# Patient Record
Sex: Female | Born: 1950
Health system: Southern US, Community
[De-identification: ages and names within clinical notes are randomized; demographics above are authoritative.]

## PROBLEM LIST (undated history)

## (undated) DIAGNOSIS — F32A Depression, unspecified: Secondary | ICD-10-CM

## (undated) DIAGNOSIS — E559 Vitamin D deficiency, unspecified: Secondary | ICD-10-CM

## (undated) DIAGNOSIS — J45909 Unspecified asthma, uncomplicated: Secondary | ICD-10-CM

## (undated) DIAGNOSIS — F419 Anxiety disorder, unspecified: Secondary | ICD-10-CM

## (undated) DIAGNOSIS — E7800 Pure hypercholesterolemia, unspecified: Secondary | ICD-10-CM

## (undated) DIAGNOSIS — M549 Dorsalgia, unspecified: Secondary | ICD-10-CM

## (undated) DIAGNOSIS — E119 Type 2 diabetes mellitus without complications: Secondary | ICD-10-CM

## (undated) DIAGNOSIS — H9190 Unspecified hearing loss, unspecified ear: Secondary | ICD-10-CM

## (undated) DIAGNOSIS — Z91018 Allergy to other foods: Secondary | ICD-10-CM

## (undated) DIAGNOSIS — J45998 Other asthma: Secondary | ICD-10-CM

## (undated) DIAGNOSIS — K219 Gastro-esophageal reflux disease without esophagitis: Secondary | ICD-10-CM

## (undated) DIAGNOSIS — R0602 Shortness of breath: Secondary | ICD-10-CM

## (undated) DIAGNOSIS — I82409 Acute embolism and thrombosis of unspecified deep veins of unspecified lower extremity: Secondary | ICD-10-CM

## (undated) DIAGNOSIS — Z8489 Family history of other specified conditions: Secondary | ICD-10-CM

## (undated) DIAGNOSIS — F329 Major depressive disorder, single episode, unspecified: Secondary | ICD-10-CM

## (undated) DIAGNOSIS — I10 Essential (primary) hypertension: Secondary | ICD-10-CM

## (undated) DIAGNOSIS — M2550 Pain in unspecified joint: Secondary | ICD-10-CM

## (undated) HISTORY — DX: Pure hypercholesterolemia, unspecified: E78.00

## (undated) HISTORY — PX: TUBAL LIGATION: SHX77

## (undated) HISTORY — DX: Vitamin D deficiency, unspecified: E55.9

## (undated) HISTORY — PX: ABDOMINAL HYSTERECTOMY: SHX81

## (undated) HISTORY — PX: COLONOSCOPY W/ BIOPSIES AND POLYPECTOMY: SHX1376

## (undated) HISTORY — PX: LAPAROSCOPIC CHOLECYSTECTOMY: SUR755

## (undated) HISTORY — DX: Allergy to other foods: Z91.018

## (undated) HISTORY — DX: Shortness of breath: R06.02

## (undated) HISTORY — PX: CARPAL TUNNEL RELEASE: SHX101

## (undated) HISTORY — DX: Pain in unspecified joint: M25.50

## (undated) HISTORY — DX: Dorsalgia, unspecified: M54.9

---

## 1999-05-14 ENCOUNTER — Other Ambulatory Visit: Admission: RE | Admit: 1999-05-14 | Discharge: 1999-05-14 | Payer: Self-pay | Admitting: Obstetrics and Gynecology

## 1999-05-14 ENCOUNTER — Encounter (INDEPENDENT_AMBULATORY_CARE_PROVIDER_SITE_OTHER): Payer: Self-pay | Admitting: Specialist

## 1999-06-28 ENCOUNTER — Inpatient Hospital Stay (HOSPITAL_COMMUNITY): Admission: RE | Admit: 1999-06-28 | Discharge: 1999-07-01 | Payer: Self-pay | Admitting: Obstetrics and Gynecology

## 1999-06-28 ENCOUNTER — Encounter (INDEPENDENT_AMBULATORY_CARE_PROVIDER_SITE_OTHER): Payer: Self-pay | Admitting: Specialist

## 1999-07-09 ENCOUNTER — Encounter: Payer: Self-pay | Admitting: Vascular Surgery

## 1999-07-09 ENCOUNTER — Inpatient Hospital Stay (HOSPITAL_COMMUNITY): Admission: EM | Admit: 1999-07-09 | Discharge: 1999-07-14 | Payer: Self-pay | Admitting: Obstetrics and Gynecology

## 1999-07-30 ENCOUNTER — Inpatient Hospital Stay (HOSPITAL_COMMUNITY): Admission: AD | Admit: 1999-07-30 | Discharge: 1999-07-30 | Payer: Self-pay | Admitting: Obstetrics and Gynecology

## 2001-04-04 ENCOUNTER — Emergency Department (HOSPITAL_COMMUNITY): Admission: EM | Admit: 2001-04-04 | Discharge: 2001-04-04 | Payer: Self-pay | Admitting: Emergency Medicine

## 2001-10-04 ENCOUNTER — Emergency Department (HOSPITAL_COMMUNITY): Admission: EM | Admit: 2001-10-04 | Discharge: 2001-10-04 | Payer: Self-pay | Admitting: Emergency Medicine

## 2001-10-05 ENCOUNTER — Encounter: Payer: Self-pay | Admitting: Emergency Medicine

## 2001-10-05 ENCOUNTER — Ambulatory Visit (HOSPITAL_COMMUNITY): Admission: RE | Admit: 2001-10-05 | Discharge: 2001-10-05 | Payer: Self-pay | Admitting: Emergency Medicine

## 2001-11-18 ENCOUNTER — Encounter: Payer: Self-pay | Admitting: Surgery

## 2001-11-22 ENCOUNTER — Observation Stay (HOSPITAL_COMMUNITY): Admission: RE | Admit: 2001-11-22 | Discharge: 2001-11-23 | Payer: Self-pay | Admitting: Surgery

## 2001-11-22 ENCOUNTER — Encounter (INDEPENDENT_AMBULATORY_CARE_PROVIDER_SITE_OTHER): Payer: Self-pay | Admitting: Specialist

## 2001-11-22 ENCOUNTER — Encounter: Payer: Self-pay | Admitting: Surgery

## 2002-06-16 ENCOUNTER — Other Ambulatory Visit: Admission: RE | Admit: 2002-06-16 | Discharge: 2002-06-16 | Payer: Self-pay | Admitting: Obstetrics and Gynecology

## 2002-07-06 ENCOUNTER — Encounter: Payer: Self-pay | Admitting: Emergency Medicine

## 2002-07-06 ENCOUNTER — Emergency Department (HOSPITAL_COMMUNITY): Admission: EM | Admit: 2002-07-06 | Discharge: 2002-07-06 | Payer: Self-pay | Admitting: Emergency Medicine

## 2003-02-14 ENCOUNTER — Ambulatory Visit (HOSPITAL_COMMUNITY): Admission: RE | Admit: 2003-02-14 | Discharge: 2003-02-14 | Payer: Self-pay | Admitting: Orthopedic Surgery

## 2003-12-22 ENCOUNTER — Encounter: Admission: RE | Admit: 2003-12-22 | Discharge: 2003-12-22 | Payer: Self-pay | Admitting: Orthopedic Surgery

## 2003-12-26 ENCOUNTER — Ambulatory Visit (HOSPITAL_BASED_OUTPATIENT_CLINIC_OR_DEPARTMENT_OTHER): Admission: RE | Admit: 2003-12-26 | Discharge: 2003-12-26 | Payer: Self-pay | Admitting: Orthopedic Surgery

## 2003-12-26 ENCOUNTER — Ambulatory Visit (HOSPITAL_COMMUNITY): Admission: RE | Admit: 2003-12-26 | Discharge: 2003-12-26 | Payer: Self-pay | Admitting: Orthopedic Surgery

## 2004-08-18 HISTORY — PX: REFRACTIVE SURGERY: SHX103

## 2004-10-08 ENCOUNTER — Emergency Department (HOSPITAL_COMMUNITY): Admission: EM | Admit: 2004-10-08 | Discharge: 2004-10-08 | Payer: Self-pay | Admitting: Emergency Medicine

## 2005-06-26 ENCOUNTER — Ambulatory Visit: Payer: Self-pay | Admitting: Cardiology

## 2005-11-28 ENCOUNTER — Encounter: Admission: RE | Admit: 2005-11-28 | Discharge: 2005-11-28 | Payer: Self-pay | Admitting: Orthopedic Surgery

## 2007-03-15 ENCOUNTER — Ambulatory Visit: Payer: Self-pay | Admitting: Internal Medicine

## 2007-03-15 DIAGNOSIS — R51 Headache: Secondary | ICD-10-CM | POA: Insufficient documentation

## 2007-03-15 DIAGNOSIS — R519 Headache, unspecified: Secondary | ICD-10-CM | POA: Insufficient documentation

## 2007-03-15 DIAGNOSIS — K219 Gastro-esophageal reflux disease without esophagitis: Secondary | ICD-10-CM | POA: Insufficient documentation

## 2007-03-15 DIAGNOSIS — E8881 Metabolic syndrome: Secondary | ICD-10-CM | POA: Insufficient documentation

## 2007-03-15 DIAGNOSIS — D869 Sarcoidosis, unspecified: Secondary | ICD-10-CM | POA: Insufficient documentation

## 2007-03-15 DIAGNOSIS — R631 Polydipsia: Secondary | ICD-10-CM | POA: Insufficient documentation

## 2007-07-01 ENCOUNTER — Ambulatory Visit (HOSPITAL_COMMUNITY): Admission: RE | Admit: 2007-07-01 | Discharge: 2007-07-01 | Payer: Self-pay | Admitting: Ophthalmology

## 2009-09-17 ENCOUNTER — Emergency Department (HOSPITAL_BASED_OUTPATIENT_CLINIC_OR_DEPARTMENT_OTHER): Admission: EM | Admit: 2009-09-17 | Discharge: 2009-09-17 | Payer: Self-pay | Admitting: Emergency Medicine

## 2009-09-17 ENCOUNTER — Ambulatory Visit: Payer: Self-pay | Admitting: Diagnostic Radiology

## 2010-09-08 ENCOUNTER — Encounter: Payer: Self-pay | Admitting: Orthopedic Surgery

## 2011-01-03 NOTE — Op Note (Signed)
NAME:  Stephanie [name], Stephanie [name]                           ACCOUNT NO.:  [id number]   MEDICAL RECORD NO.:  [id number]                   PATIENT TYPE:  AMB   LOCATION:  [facility]                                 FACILITY:  [facility]   PHYSICIAN:  Georges Lynch. Darrelyn Hillock, M.D.             DATE OF BIRTH:  [birth year]   DATE OF PROCEDURE:  12/26/2003  DATE OF DISCHARGE:                                 OPERATIVE REPORT   SURGEON:  Georges Lynch. Gioffre, M.D.   ASSISTANT:  None.   PREOPERATIVE DIAGNOSIS:  Severe carpal tunnel syndrome, left.   POSTOPERATIVE DIAGNOSIS:  Severe carpal tunnel syndrome, left.   OPERATION:  Left carpal tunnel release.   PROCEDURE IN DETAIL:  Under general anesthesia, a routine orthopedic prep  and drape of the left upper extremity was carried out.  A lazy-S incision  was made over the palmar aspect of the left hand, bleeders were identified  and cauterized.  The self-retaining retractor was inserted, identified the  superficial transverse carpal ligaments.  I protected the median nerve.  I  then incised the ligaments in the usual fashion and traced the nerve down  into the palm.  It was now free in both directions.  I thoroughly irrigated  out the area, closed the skin only with 3-0 and 4-0 nylon sutures.  Sterile  Neosporin bundle dressings were applied.  The patient left the operating  room in satisfactory condition.                                               Ronald A. Darrelyn Hillock, M.D.    RAG/MEDQ  D:  12/26/2003  T:  12/26/2003  Job:  [id number]

## 2011-01-03 NOTE — Op Note (Signed)
   NAME:  Stephanie [name], Stephanie [name]                           ACCOUNT NO.:  [id number]   MEDICAL RECORD NO.:  [id number]                   PATIENT TYPE:  AMB   LOCATION:  DAY                                  FACILITY:  [facility]   PHYSICIAN:  Georges Lynch. Darrelyn Hillock, M.D.             DATE OF BIRTH:  04-27-1951   DATE OF PROCEDURE:  02/14/2003  DATE OF DISCHARGE:                                 OPERATIVE REPORT   SURGEON:  Georges Lynch. Darrelyn Hillock, M.D.   ASSISTANT:  Nurse.   PREOPERATIVE DIAGNOSIS:  Severe carpal tunnel syndrome on the right.   POSTOPERATIVE DIAGNOSIS:  Severe carpal tunnel syndrome on the right.   OPERATION:  Right carpal tunnel release.   DESCRIPTION OF PROCEDURE:  Under IV regional anesthesia, routine orthopedic  prep and draping of the right hand was carried out. A curvilinear incision  was made over the palmar aspect of the right hand, bleeders identified and  cauterized with the bipolar. Great care was taken not to injure the  underlying neurological structures. At this time I identified the distal  transverse carpal ligaments. I protected the median nerve with a freer and  then incised the ligaments, totally freed up the nerve, her canal was  extremely tight. Following this, I thoroughly irrigated out the area, the  nerve now was freed, there was no other abnormality. I did trace the nerve  down into the palm and made sure there were no constrictions down distally.  The wound then was closed with 3-0 nylon sutures, sterile Neosporin bundle  dressing was applied. The patient left the operating room in satisfactory  condition.                                               Ronald A. Darrelyn Hillock, M.D.    RAG/MEDQ  D:  02/14/2003  T:  02/14/2003  Job:  [id number]

## 2011-01-03 NOTE — Op Note (Signed)
[facility]  Patient:    Stephanie [name], Stephanie [name] Visit Number: [id number] MRN: [id number]          Service Type: DSU Location: DAY Attending Physician:  Katha Cabal Dictated by:   Thornton Park Daphine Deutscher, M.D. Proc. Date: 11/22/01 Admit Date:  11/22/2001   CC:         Colon Branch, M.D.  [address] [place], [place] [address]   Operative Report  PREOPERATIVE DIAGNOSES: 1. Cholelithiasis. 2. Chronic cholecystitis.  POSTOPERATIVE DIAGNOSES: 1. Cholelithiasis. 2. Cholecystitis.  SURGEON:  Thornton Park. Daphine Deutscher, M.D.  ASSISTANT:  Currie Paris, M.D.  PROCEDURE:  Laparoscopic cholecystectomy and intraoperative cholangiogram.  FINDINGS:  The patient has evidence of chronic cholecystitis with adhesions to the fundus and infundibulum of the gallbladder, a normal intraoperative cholangiogram with nice tapering of the distal common bile duct and free flow into the duodenum; about a 1.5-cm yellow cholesterol stone. Small bowel adhesions noted to the lower midline incision from prior surgery at the time we were completing this operation.  DESCRIPTION OF PROCEDURE:  Stephanie [name] is a 60 year old lady who was brought to OR #1. Preoperatively, she received 500 units of subcutaneous heparin two hours preoperatively and had PAS hose in place. The abdomen was then prepped with Betadine, draped sterilely. A longitudinal incision was made into her umbilicus and through a slightly oblique type incision. I dissected down and entered the abdomen. Pursestring suture had been applied. The abdomen was insufflated. Three trocars were placed in the upper abdomen. The gallbladder was grasped and elevated. She had numerous filmy adhesions to the fundus and the infundibulum and these were stripped away. The infundibulum was retracted and dissected free of the cystic duct and put a clip up on the gallbladder and then incised the cystic duct and inserted the Reddick catheter and  took a dynamic cholangiogram which showed that this seemed to traverse to a fairly high take off of the cystic duct with good intrahepatic filling, and a slightly dilated common bile duct with free flow into the duodenum and nice tapering of the distal common bile duct. No filling defects were noted. The cystic duct was triple clipped, divided; the cystic artery was double clipped, divided, and then the gallbladder was removed with the hook. I did enter it about three-quarters of the way up the gallbladder and I decompressed it although there was no stone spillage. Hemostasis was achieved as we worked our way up the gallbladder bed and no bleeding or bile leaks were noted. Once detached, the gallbladder was placed in the bag and brought up through the umbilicus although I did have to deliver the stone with ring forceps and this enabled me to bring the gallbladder out with the bag. The umbilical defect, being somewhat oblique, I felt the best closure would be with the Endoclose device and I used that with a 0 Vicryl and inserted that and placed it across the wound and then tied that down. This closed the umbilical defect.  Also as indicated above, the patient did have some bowel loops fairly densely adherent to the lower midline which were not accessible from my upper four ports but are noted for the record. These did not appear to be obstructing or putting in her any immediate threat.  The wounds were injected with 0.5% Marcaine. The skin was closed with 4-0 Vicryl with benzoin and Steri-Strips on the skin. The patient tolerated the procedure well and was taken to the recovery room in satisfactory condition.  She will be discharged with Maxidone to take for pain and will be followed up in the office in two weeks. She will plan to stay for overnight observation. Dictated by:   Thornton Park Daphine Deutscher, M.D. Attending Physician:  Katha Cabal DD:  11/22/01 TD:  11/22/01 Job:  [id number]

## 2011-05-27 LAB — BASIC METABOLIC PANEL
Chloride: 105
Creatinine, Ser: 0.84
GFR calc non Af Amer: >60
Glucose, Bld: 122 — ABNORMAL HIGH
Potassium: 3.9
Sodium: 138

## 2012-02-27 ENCOUNTER — Other Ambulatory Visit (HOSPITAL_BASED_OUTPATIENT_CLINIC_OR_DEPARTMENT_OTHER): Payer: Self-pay | Admitting: Family Medicine

## 2012-02-27 DIAGNOSIS — M25569 Pain in unspecified knee: Secondary | ICD-10-CM

## 2012-02-28 ENCOUNTER — Other Ambulatory Visit (HOSPITAL_BASED_OUTPATIENT_CLINIC_OR_DEPARTMENT_OTHER): Payer: Self-pay

## 2012-03-02 ENCOUNTER — Ambulatory Visit (HOSPITAL_BASED_OUTPATIENT_CLINIC_OR_DEPARTMENT_OTHER)
Admission: RE | Admit: 2012-03-02 | Discharge: 2012-03-02 | Disposition: A | Discharge status: Home / Self Care | Payer: BC Managed Care – PPO | Source: Ambulatory Visit | Attending: Family Medicine | Admitting: Family Medicine

## 2012-03-02 DIAGNOSIS — M1710 Unilateral primary osteoarthritis, unspecified knee: Secondary | ICD-10-CM | POA: Insufficient documentation

## 2012-03-02 DIAGNOSIS — M7120 Synovial cyst of popliteal space [Baker], unspecified knee: Secondary | ICD-10-CM | POA: Insufficient documentation

## 2012-03-02 DIAGNOSIS — M25569 Pain in unspecified knee: Secondary | ICD-10-CM

## 2012-09-23 ENCOUNTER — Emergency Department (HOSPITAL_BASED_OUTPATIENT_CLINIC_OR_DEPARTMENT_OTHER)
Admission: EM | Admit: 2012-09-23 | Discharge: 2012-09-23 | Disposition: A | Discharge status: Home / Self Care | Payer: BC Managed Care – PPO | Attending: Emergency Medicine | Admitting: Emergency Medicine

## 2012-09-23 ENCOUNTER — Encounter (HOSPITAL_BASED_OUTPATIENT_CLINIC_OR_DEPARTMENT_OTHER): Payer: Self-pay | Admitting: Emergency Medicine

## 2012-09-23 DIAGNOSIS — I10 Essential (primary) hypertension: Secondary | ICD-10-CM | POA: Insufficient documentation

## 2012-09-23 DIAGNOSIS — Z79899 Other long term (current) drug therapy: Secondary | ICD-10-CM | POA: Insufficient documentation

## 2012-09-23 DIAGNOSIS — J45909 Unspecified asthma, uncomplicated: Secondary | ICD-10-CM | POA: Insufficient documentation

## 2012-09-23 DIAGNOSIS — K5289 Other specified noninfective gastroenteritis and colitis: Secondary | ICD-10-CM | POA: Insufficient documentation

## 2012-09-23 DIAGNOSIS — K529 Noninfective gastroenteritis and colitis, unspecified: Secondary | ICD-10-CM

## 2012-09-23 DIAGNOSIS — R6883 Chills (without fever): Secondary | ICD-10-CM | POA: Insufficient documentation

## 2012-09-23 DIAGNOSIS — R197 Diarrhea, unspecified: Secondary | ICD-10-CM | POA: Insufficient documentation

## 2012-09-23 DIAGNOSIS — R5381 Other malaise: Secondary | ICD-10-CM | POA: Insufficient documentation

## 2012-09-23 HISTORY — DX: Unspecified asthma, uncomplicated: J45.909

## 2012-09-23 HISTORY — DX: Essential (primary) hypertension: I10

## 2012-09-23 LAB — COMPREHENSIVE METABOLIC PANEL
AST: 55 U/L — ABNORMAL HIGH (ref 0–37)
Albumin: 3.9 g/dL (ref 3.5–5.2)
Alkaline Phosphatase: 74 U/L (ref 39–117)
CO2: 21 mEq/L (ref 19–32)
Glucose, Bld: 214 mg/dL — ABNORMAL HIGH (ref 70–99)
Potassium: 3.5 mEq/L (ref 3.5–5.1)
Sodium: 139 mEq/L (ref 135–145)
Total Bilirubin: 0.6 mg/dL (ref 0.3–1.2)

## 2012-09-23 LAB — CBC WITH DIFFERENTIAL/PLATELET
Basophils Absolute: 0 10*3/uL (ref 0.0–0.1)
HCT: 38.6 % (ref 36.0–46.0)
Lymphocytes Relative: 7 % — ABNORMAL LOW (ref 12–46)
MCH: 30.6 pg (ref 26.0–34.0)
MCHC: 35.2 g/dL (ref 30.0–36.0)
Neutro Abs: 8.6 10*3/uL — ABNORMAL HIGH (ref 1.7–7.7)
Neutrophils Relative %: 87 % — ABNORMAL HIGH (ref 43–77)
RDW: 14 % (ref 11.5–15.5)

## 2012-09-23 MED ORDER — PROMETHAZINE HCL 25 MG/ML IJ SOLN
12.5000 mg | Freq: Once | INTRAMUSCULAR | Status: AC
  Administered 2012-09-23: 12.5 mg via INTRAVENOUS
  Filled 2012-09-23: qty 1

## 2012-09-23 MED ORDER — SODIUM CHLORIDE 0.9 % IV BOLUS (SEPSIS)
1000.0000 mL | Freq: Once | INTRAVENOUS | Status: AC
  Administered 2012-09-23: 1000 mL via INTRAVENOUS

## 2012-09-23 MED ORDER — ONDANSETRON HCL 8 MG PO TABS: 8.0000 mg | ORAL_TABLET | ORAL | Status: DC | PRN

## 2012-09-23 MED ORDER — KETOROLAC TROMETHAMINE 30 MG/ML IJ SOLN
INTRAMUSCULAR | Status: AC
  Administered 2012-09-23: 30 mg via INTRAVENOUS
  Filled 2012-09-23: qty 1

## 2012-09-23 MED ORDER — ONDANSETRON HCL 4 MG/2ML IJ SOLN
4.0000 mg | Freq: Once | INTRAMUSCULAR | Status: AC
  Administered 2012-09-23: 4 mg via INTRAVENOUS

## 2012-09-23 MED ORDER — SODIUM CHLORIDE 0.9 % IV BOLUS (SEPSIS): 1000.0000 mL | Freq: Once | INTRAVENOUS | Status: DC

## 2012-09-23 MED ORDER — PROMETHAZINE HCL 25 MG/ML IJ SOLN: 12.5000 mg | Freq: Once | INTRAMUSCULAR | Status: DC

## 2012-09-23 MED ORDER — KETOROLAC TROMETHAMINE 30 MG/ML IJ SOLN
30.0000 mg | Freq: Once | INTRAMUSCULAR | Status: AC
  Administered 2012-09-23: 30 mg via INTRAVENOUS

## 2012-09-23 MED ORDER — ONDANSETRON HCL 4 MG/2ML IJ SOLN
INTRAMUSCULAR | Status: AC
  Administered 2012-09-23: 4 mg via INTRAVENOUS
  Filled 2012-09-23: qty 2

## 2012-09-23 NOTE — ED Notes (Addendum)
Nausea since 4pm yesterday afternoon.  HA and bodyaches.  Ate supper at 7pm. Developed Vomiting and diarrhea at 9pm.

## 2012-09-23 NOTE — ED Provider Notes (Signed)
History     CSN: [id number]  Arrival date & time      First MD Initiated Contact with Patient 09/23/12 0138      Chief Complaint  Patient presents with  . Emesis  . Diarrhea    (Consider location/radiation/quality/duration/timing/severity/associated sxs/prior treatment) HPI Comments: Patient by EMS for eval of sudden onset of n/v/d at 4 PM yesterday.  All non-bloody.  She denies fevers or chills.  Denies sick contacts.  Felt fine prior to this afternoon.    Patient is a 62 y.o. female presenting with vomiting. The history is provided by the patient.  Emesis  This is a new problem. Episode onset: 1600 yesterday. The problem occurs continuously. The problem has been rapidly worsening. The emesis has an appearance of stomach contents. There has been no fever. Associated symptoms include chills and diarrhea. Pertinent negatives include no fever.    Past Medical History  Diagnosis Date  . Asthma     No past surgical history on file.  No family history on file.  History  Substance Use Topics  . Smoking status: Not on file  . Smokeless tobacco: Not on file  . Alcohol Use:     OB History    Grav Para Term Preterm Abortions TAB SAB Ect Mult Living                  Review of Systems  Constitutional: Positive for chills and fatigue. Negative for fever.  Gastrointestinal: Positive for nausea, vomiting and diarrhea.  All other systems reviewed and are negative.    Allergies  Review of patient's allergies indicates not on file.  Home Medications  No current outpatient prescriptions on file.  SpO2 96%  Physical Exam  Nursing note and vitals reviewed. Constitutional: She is oriented to person, place, and time. She appears well-developed and well-nourished.       Appears pale, uncomfortable.  HENT:  Head: Normocephalic and atraumatic.  Mouth/Throat: Oropharynx is clear and moist.       MM's moist.  Neck: Normal range of motion. Neck supple.  Cardiovascular: Normal  rate and regular rhythm.  Exam reveals no gallop and no friction rub.   No murmur heard. Pulmonary/Chest: Effort normal and breath sounds normal. No respiratory distress. She has no wheezes.  Abdominal: Soft. Bowel sounds are normal. She exhibits no distension. There is no tenderness.  Musculoskeletal: Normal range of motion.  Neurological: She is alert and oriented to person, place, and time.  Skin: Skin is warm and dry.    ED Course  Procedures (including critical care time)   Labs Reviewed  CBC WITH DIFFERENTIAL  COMPREHENSIVE METABOLIC PANEL   No results found.   No diagnosis found.    MDM  Presentation, exam, and labs consistent with viral gastroenteritis.  She is feeling better with fluids and meds and will be discharged to home with zofran.  Return prn.        Geoffery Lyons, MD 09/23/12 [phone]

## 2012-09-23 NOTE — ED Notes (Signed)
MD at bedside. 

## 2015-12-12 DIAGNOSIS — J45909 Unspecified asthma, uncomplicated: Secondary | ICD-10-CM | POA: Diagnosis not present

## 2015-12-12 DIAGNOSIS — I10 Essential (primary) hypertension: Secondary | ICD-10-CM | POA: Diagnosis not present

## 2015-12-12 DIAGNOSIS — K219 Gastro-esophageal reflux disease without esophagitis: Secondary | ICD-10-CM | POA: Diagnosis not present

## 2015-12-12 DIAGNOSIS — E7800 Pure hypercholesterolemia, unspecified: Secondary | ICD-10-CM | POA: Diagnosis not present

## 2015-12-12 DIAGNOSIS — E559 Vitamin D deficiency, unspecified: Secondary | ICD-10-CM | POA: Diagnosis not present

## 2015-12-12 DIAGNOSIS — Z1329 Encounter for screening for other suspected endocrine disorder: Secondary | ICD-10-CM | POA: Diagnosis not present

## 2015-12-18 DIAGNOSIS — R7301 Impaired fasting glucose: Secondary | ICD-10-CM | POA: Diagnosis not present

## 2016-01-02 DIAGNOSIS — E119 Type 2 diabetes mellitus without complications: Secondary | ICD-10-CM | POA: Diagnosis not present

## 2016-01-02 DIAGNOSIS — E7800 Pure hypercholesterolemia, unspecified: Secondary | ICD-10-CM | POA: Diagnosis not present

## 2016-02-11 ENCOUNTER — Ambulatory Visit: Payer: BLUE CROSS/BLUE SHIELD | Admitting: Nutrition

## 2016-02-21 ENCOUNTER — Other Ambulatory Visit: Payer: Self-pay | Admitting: Family Medicine

## 2016-02-21 DIAGNOSIS — D120 Benign neoplasm of cecum: Secondary | ICD-10-CM | POA: Diagnosis not present

## 2016-02-21 DIAGNOSIS — K6389 Other specified diseases of intestine: Secondary | ICD-10-CM | POA: Diagnosis not present

## 2016-02-21 DIAGNOSIS — D126 Benign neoplasm of colon, unspecified: Secondary | ICD-10-CM | POA: Diagnosis not present

## 2016-02-21 DIAGNOSIS — K5730 Diverticulosis of large intestine without perforation or abscess without bleeding: Secondary | ICD-10-CM | POA: Diagnosis not present

## 2016-02-21 DIAGNOSIS — Z1211 Encounter for screening for malignant neoplasm of colon: Secondary | ICD-10-CM | POA: Diagnosis not present

## 2016-02-21 DIAGNOSIS — K648 Other hemorrhoids: Secondary | ICD-10-CM | POA: Diagnosis not present

## 2016-02-21 DIAGNOSIS — K635 Polyp of colon: Secondary | ICD-10-CM | POA: Diagnosis not present

## 2016-02-21 DIAGNOSIS — D124 Benign neoplasm of descending colon: Secondary | ICD-10-CM | POA: Diagnosis not present

## 2016-02-25 DIAGNOSIS — Z1231 Encounter for screening mammogram for malignant neoplasm of breast: Secondary | ICD-10-CM | POA: Diagnosis not present

## 2016-04-22 DIAGNOSIS — I10 Essential (primary) hypertension: Secondary | ICD-10-CM | POA: Diagnosis not present

## 2016-04-22 DIAGNOSIS — E7800 Pure hypercholesterolemia, unspecified: Secondary | ICD-10-CM | POA: Diagnosis not present

## 2016-04-22 DIAGNOSIS — J45909 Unspecified asthma, uncomplicated: Secondary | ICD-10-CM | POA: Diagnosis not present

## 2016-04-22 DIAGNOSIS — E119 Type 2 diabetes mellitus without complications: Secondary | ICD-10-CM | POA: Diagnosis not present

## 2016-04-22 DIAGNOSIS — E559 Vitamin D deficiency, unspecified: Secondary | ICD-10-CM | POA: Diagnosis not present

## 2016-04-22 DIAGNOSIS — K219 Gastro-esophageal reflux disease without esophagitis: Secondary | ICD-10-CM | POA: Diagnosis not present

## 2016-05-12 DIAGNOSIS — Z23 Encounter for immunization: Secondary | ICD-10-CM | POA: Diagnosis not present

## 2016-08-06 DIAGNOSIS — J45901 Unspecified asthma with (acute) exacerbation: Secondary | ICD-10-CM | POA: Diagnosis not present

## 2016-08-06 DIAGNOSIS — R0789 Other chest pain: Secondary | ICD-10-CM | POA: Diagnosis not present

## 2016-08-17 DIAGNOSIS — R05 Cough: Secondary | ICD-10-CM | POA: Diagnosis not present

## 2016-10-13 ENCOUNTER — Ambulatory Visit (HOSPITAL_BASED_OUTPATIENT_CLINIC_OR_DEPARTMENT_OTHER)
Admission: RE | Admit: 2016-10-13 | Discharge: 2016-10-13 | Disposition: A | Discharge status: Home / Self Care | Payer: BLUE CROSS/BLUE SHIELD | Source: Ambulatory Visit | Attending: Family Medicine | Admitting: Family Medicine

## 2016-10-13 ENCOUNTER — Other Ambulatory Visit (HOSPITAL_BASED_OUTPATIENT_CLINIC_OR_DEPARTMENT_OTHER): Payer: Self-pay | Admitting: Family Medicine

## 2016-10-13 ENCOUNTER — Encounter (HOSPITAL_BASED_OUTPATIENT_CLINIC_OR_DEPARTMENT_OTHER): Payer: Self-pay

## 2016-10-13 DIAGNOSIS — R1032 Left lower quadrant pain: Secondary | ICD-10-CM | POA: Diagnosis not present

## 2016-10-13 DIAGNOSIS — K5732 Diverticulitis of large intestine without perforation or abscess without bleeding: Secondary | ICD-10-CM | POA: Diagnosis not present

## 2016-10-13 DIAGNOSIS — R8299 Other abnormal findings in urine: Secondary | ICD-10-CM | POA: Diagnosis not present

## 2016-10-13 MED ORDER — IOPAMIDOL (ISOVUE-300) INJECTION 61%
100.0000 mL | Freq: Once | INTRAVENOUS | Status: AC | PRN
  Administered 2016-10-13: 100 mL via INTRAVENOUS

## 2016-11-10 DIAGNOSIS — E119 Type 2 diabetes mellitus without complications: Secondary | ICD-10-CM | POA: Diagnosis not present

## 2016-11-10 DIAGNOSIS — E7800 Pure hypercholesterolemia, unspecified: Secondary | ICD-10-CM | POA: Diagnosis not present

## 2016-11-10 DIAGNOSIS — I10 Essential (primary) hypertension: Secondary | ICD-10-CM | POA: Diagnosis not present

## 2016-12-31 DIAGNOSIS — M25511 Pain in right shoulder: Secondary | ICD-10-CM | POA: Diagnosis not present

## 2016-12-31 DIAGNOSIS — M542 Cervicalgia: Secondary | ICD-10-CM | POA: Diagnosis not present

## 2017-01-14 DIAGNOSIS — Z79899 Other long term (current) drug therapy: Secondary | ICD-10-CM | POA: Diagnosis not present

## 2017-01-14 DIAGNOSIS — E7800 Pure hypercholesterolemia, unspecified: Secondary | ICD-10-CM | POA: Diagnosis not present

## 2017-04-21 DIAGNOSIS — R1012 Left upper quadrant pain: Secondary | ICD-10-CM | POA: Diagnosis not present

## 2017-04-30 DIAGNOSIS — J45909 Unspecified asthma, uncomplicated: Secondary | ICD-10-CM | POA: Diagnosis not present

## 2017-04-30 DIAGNOSIS — K5792 Diverticulitis of intestine, part unspecified, without perforation or abscess without bleeding: Secondary | ICD-10-CM | POA: Diagnosis not present

## 2017-04-30 DIAGNOSIS — I10 Essential (primary) hypertension: Secondary | ICD-10-CM | POA: Diagnosis not present

## 2017-04-30 DIAGNOSIS — E119 Type 2 diabetes mellitus without complications: Secondary | ICD-10-CM | POA: Diagnosis not present

## 2017-04-30 DIAGNOSIS — Z23 Encounter for immunization: Secondary | ICD-10-CM | POA: Diagnosis not present

## 2017-05-27 DIAGNOSIS — Z23 Encounter for immunization: Secondary | ICD-10-CM | POA: Diagnosis not present

## 2017-06-02 DIAGNOSIS — Z1231 Encounter for screening mammogram for malignant neoplasm of breast: Secondary | ICD-10-CM | POA: Diagnosis not present

## 2017-06-24 DIAGNOSIS — L02416 Cutaneous abscess of left lower limb: Secondary | ICD-10-CM | POA: Diagnosis not present

## 2017-09-29 ENCOUNTER — Other Ambulatory Visit: Payer: Self-pay

## 2017-09-29 ENCOUNTER — Emergency Department (HOSPITAL_BASED_OUTPATIENT_CLINIC_OR_DEPARTMENT_OTHER): Payer: BLUE CROSS/BLUE SHIELD

## 2017-09-29 ENCOUNTER — Emergency Department (HOSPITAL_BASED_OUTPATIENT_CLINIC_OR_DEPARTMENT_OTHER)
Admission: EM | Admit: 2017-09-29 | Discharge: 2017-09-29 | Disposition: A | Discharge status: Home / Self Care | Payer: BLUE CROSS/BLUE SHIELD | Attending: Emergency Medicine | Admitting: Emergency Medicine

## 2017-09-29 ENCOUNTER — Encounter (HOSPITAL_BASED_OUTPATIENT_CLINIC_OR_DEPARTMENT_OTHER): Payer: Self-pay | Admitting: Emergency Medicine

## 2017-09-29 DIAGNOSIS — Z79899 Other long term (current) drug therapy: Secondary | ICD-10-CM | POA: Insufficient documentation

## 2017-09-29 DIAGNOSIS — R1032 Left lower quadrant pain: Secondary | ICD-10-CM | POA: Diagnosis not present

## 2017-09-29 DIAGNOSIS — K5792 Diverticulitis of intestine, part unspecified, without perforation or abscess without bleeding: Secondary | ICD-10-CM | POA: Insufficient documentation

## 2017-09-29 DIAGNOSIS — Z7982 Long term (current) use of aspirin: Secondary | ICD-10-CM | POA: Insufficient documentation

## 2017-09-29 DIAGNOSIS — R110 Nausea: Secondary | ICD-10-CM | POA: Diagnosis not present

## 2017-09-29 DIAGNOSIS — I10 Essential (primary) hypertension: Secondary | ICD-10-CM | POA: Insufficient documentation

## 2017-09-29 DIAGNOSIS — J45909 Unspecified asthma, uncomplicated: Secondary | ICD-10-CM | POA: Diagnosis not present

## 2017-09-29 LAB — CBC
HCT: 38.2 % (ref 36.0–46.0)
HEMOGLOBIN: 12.6 g/dL (ref 12.0–15.0)
MCH: 29.3 pg (ref 26.0–34.0)
MCHC: 33 g/dL (ref 30.0–36.0)
MCV: 88.8 fL (ref 78.0–100.0)
PLATELETS: 307 10*3/uL (ref 150–400)
RBC: 4.3 MIL/uL (ref 3.87–5.11)
RDW: 14.8 % (ref 11.5–15.5)
WBC: 10.5 10*3/uL (ref 4.0–10.5)

## 2017-09-29 LAB — COMPREHENSIVE METABOLIC PANEL
ALBUMIN: 3.9 g/dL (ref 3.5–5.0)
ALK PHOS: 125 U/L (ref 38–126)
ALT: 25 U/L (ref 14–54)
ANION GAP: 12 (ref 5–15)
AST: 29 U/L (ref 15–41)
BILIRUBIN TOTAL: 0.5 mg/dL (ref 0.3–1.2)
BUN: 13 mg/dL (ref 6–20)
CALCIUM: 9.6 mg/dL (ref 8.9–10.3)
CO2: 25 mmol/L (ref 22–32)
CREATININE: 0.61 mg/dL (ref 0.44–1.00)
Chloride: 102 mmol/L (ref 101–111)
GFR calc Af Amer: >60 mL/min (ref >60)
GFR calc non Af Amer: >60 mL/min (ref >60)
GLUCOSE: 83 mg/dL (ref 65–99)
Potassium: 3.5 mmol/L (ref 3.5–5.1)
Sodium: 139 mmol/L (ref 135–145)
Total Protein: 8.2 g/dL — ABNORMAL HIGH (ref 6.5–8.1)

## 2017-09-29 LAB — URINALYSIS, ROUTINE W REFLEX MICROSCOPIC
Glucose, UA: NEGATIVE mg/dL
KETONES UR: NEGATIVE mg/dL
LEUKOCYTES UA: NEGATIVE
Nitrite: NEGATIVE
PROTEIN: NEGATIVE mg/dL
Specific Gravity, Urine: 1.025 (ref 1.005–1.030)
pH: 6 (ref 5.0–8.0)

## 2017-09-29 LAB — URINALYSIS, MICROSCOPIC (REFLEX)

## 2017-09-29 LAB — LIPASE, BLOOD: Lipase: 28 U/L (ref 11–51)

## 2017-09-29 MED ORDER — ONDANSETRON 4 MG PO TBDP: 4.0000 mg | ORAL_TABLET | Freq: Three times a day (TID) | ORAL | 0 refills | Status: DC | PRN

## 2017-09-29 MED ORDER — SODIUM CHLORIDE 0.9 % IV BOLUS (SEPSIS)
1000.0000 mL | Freq: Once | INTRAVENOUS | Status: AC
  Administered 2017-09-29: 1000 mL via INTRAVENOUS

## 2017-09-29 MED ORDER — AMOXICILLIN-POT CLAVULANATE 875-125 MG PO TABS: 1.0000 | ORAL_TABLET | Freq: Two times a day (BID) | ORAL | 0 refills | Status: DC

## 2017-09-29 MED ORDER — IOPAMIDOL (ISOVUE-300) INJECTION 61%
100.0000 mL | Freq: Once | INTRAVENOUS | Status: AC | PRN
  Administered 2017-09-29: 100 mL via INTRAVENOUS

## 2017-09-29 MED ORDER — MORPHINE SULFATE (PF) 4 MG/ML IV SOLN
4.0000 mg | Freq: Once | INTRAVENOUS | Status: AC
  Administered 2017-09-29: 4 mg via INTRAVENOUS
  Filled 2017-09-29: qty 1

## 2017-09-29 MED ORDER — ONDANSETRON HCL 4 MG/2ML IJ SOLN
4.0000 mg | Freq: Once | INTRAMUSCULAR | Status: AC
  Administered 2017-09-29: 4 mg via INTRAVENOUS
  Filled 2017-09-29: qty 2

## 2017-09-29 NOTE — ED Notes (Signed)
Urine sample and culture obtained and sent to lab.  

## 2017-09-29 NOTE — ED Provider Notes (Signed)
[facility] EMERGENCY DEPARTMENT Provider Note   CSN: [id number] Arrival date & time: 09/29/17  1111     History   Chief Complaint Chief Complaint  Patient presents with  . Abdominal Pain    HPI Stephanie [name] is a 67 y.o. female with history of GERD, cholecystectomy, hysterectomy, bilateral oophorectomy, diverticulitis here for evaluation of constant, stabbing, left lower quadrant abdominal pain since Friday, 5 days associated with mild intermittent nausea. She called her primary care doctor and he prescribed her amoxicillin as he suspected it may be diverticulitis again. No fevers, chills, chest pain, shortness of breath, vomiting, dysuria, hematuria, urinary frequency, abnormal vaginal discharge or bleeding, diarrhea, melena or hematochezia. States this is the third time she's had pain in her left lower quadrant and every time it feels the same, she is suspecting this is diverticulitis again. HPI  Past Medical History:  Diagnosis Date  . Asthma   . Hypertension     Patient Active Problem List   Diagnosis Date Noted  . SARCOIDOSIS 03/15/2007  . METABOLIC SYNDROME X 03/15/2007  . G E R D 03/15/2007  . SYMPTOM, POLYDIPSIA 03/15/2007  . SYMPTOM, HEADACHE 03/15/2007    Past Surgical History:  Procedure Laterality Date  . ABDOMINAL HYSTERECTOMY    . CARPAL TUNNEL RELEASE    . CHOLECYSTECTOMY      OB History    No data available       Home Medications    Prior to Admission medications   Medication Sig Start Date End Date Taking? Authorizing Provider  aspirin EC 81 MG tablet Take 81 mg by mouth daily.   Yes [provider]  RABEprazole (ACIPHEX) 20 MG tablet Take 20 mg by mouth daily.   Yes [provider]  albuterol (PROVENTIL HFA;VENTOLIN HFA) 108 (90 BASE) MCG/ACT inhaler Inhale 2 puffs into the lungs every 6 (six) hours as needed.    [provider]  amoxicillin-clavulanate (AUGMENTIN) 875-125 MG tablet Take 1 tablet by mouth  every 12 (twelve) hours. 09/29/17   Liberty HandyGibbons, Claudia J, PA-C  atenolol (TENORMIN) 100 MG tablet Take 100 mg by mouth daily.    [provider]  ondansetron (ZOFRAN ODT) 4 MG disintegrating tablet Take 1 tablet (4 mg total) by mouth every 8 (eight) hours as needed for nausea or vomiting. 09/29/17   Liberty HandyGibbons, Claudia J, PA-C  ondansetron (ZOFRAN) 8 MG tablet Take 1 tablet (8 mg total) by mouth every 4 (four) hours as needed for nausea. 09/23/12   Geoffery Lyonselo, Douglas, MD  pantoprazole (PROTONIX) 20 MG tablet Take 20 mg by mouth daily.    [provider]  sertraline (ZOLOFT) 100 MG tablet Take 100 mg by mouth daily.    [provider]    Family History History reviewed. No pertinent family history.  Social History Social History   Tobacco Use  . Smoking status: Former Games developermoker  . Smokeless tobacco: Never Used  Substance Use Topics  . Alcohol use: No  . Drug use: No     Allergies   Albuterol; Flagyl [metronidazole]; and Shellfish allergy   Review of Systems Review of Systems  Gastrointestinal: Positive for abdominal pain and nausea.  All other systems reviewed and are negative.    Physical Exam Updated Vital Signs BP 130/80 (BP Location: Right Arm)   Pulse 80   Temp 98.2 F (36.8 C) (Oral)   Resp 16   Ht 4' 10.75" (1.492 m)   Wt 92.5 kg (204 lb)   SpO2 99%  BMI 41.55 kg/m   Physical Exam  Constitutional: She is oriented to person, place, and time. She appears well-developed and well-nourished. No distress.  Nontoxic.  HENT:  Head: Normocephalic and atraumatic.  Nose: Nose normal.  Mouth/Throat: Oropharynx is clear and moist. No oropharyngeal exudate.  Moist mucous membranes.  Eyes: Conjunctivae and EOM are normal. Pupils are equal, round, and reactive to light.  Neck: Normal range of motion. Neck supple.  Cardiovascular: Normal rate, regular rhythm, normal heart sounds and intact distal pulses.  No murmur heard. 2+ DP and radial pulses bilaterally.  No lower extremity edema.  Pulmonary/Chest: Effort normal and breath sounds normal. She exhibits no tenderness.  No chest tenderness.  Abdominal: Soft. Bowel sounds are normal. She exhibits no distension. There is tenderness in the left lower quadrant.  Obese abdomen. Soft. Focal tenderness to LLQ with guarding. No R/R. No suprapubic or CVAT.   Musculoskeletal: Normal range of motion. She exhibits no deformity.  Lymphadenopathy:    She has no cervical adenopathy.  Neurological: She is alert and oriented to person, place, and time. No sensory deficit.  Skin: Skin is warm and dry. Capillary refill takes less than 2 seconds.  No rash to abdomen   Psychiatric: She has a normal mood and affect. Her behavior is normal. Judgment and thought content normal.  Nursing note and vitals reviewed.    ED Treatments / Results  Labs (all labs ordered are listed, but only abnormal results are displayed) Labs Reviewed  URINALYSIS, ROUTINE W REFLEX MICROSCOPIC - Abnormal; Notable for the following components:      Result Value   Hgb urine dipstick SMALL (*)    Bilirubin Urine SMALL (*)    All other components within normal limits  URINALYSIS, MICROSCOPIC (REFLEX) - Abnormal; Notable for the following components:   Bacteria, UA RARE (*)    Squamous Epithelial / LPF 0-5 (*)    All other components within normal limits  COMPREHENSIVE METABOLIC PANEL - Abnormal; Notable for the following components:   Total Protein 8.2 (*)    All other components within normal limits  URINE CULTURE  LIPASE, BLOOD  CBC    EKG  EKG Interpretation None       Radiology Ct Abdomen Pelvis W Contrast  Result Date: 09/29/2017 CLINICAL DATA:  Left lower quadrant pain and nausea for 4 days. History of diverticulitis. EXAM: CT ABDOMEN AND PELVIS WITH CONTRAST TECHNIQUE: Multidetector CT imaging of the abdomen and pelvis was performed using the standard protocol following bolus administration of intravenous contrast.  CONTRAST:  100 cc of Isovue 300 COMPARISON:  10/13/2016 FINDINGS: Lower chest: Tiny right lower lobe pulmonary nodule can be presumed benign, given stability of nearly 1 year. Mild cardiomegaly, without pericardial effusion. Lad coronary artery atherosclerosis. Hepatobiliary: Normal liver. Cholecystectomy, without biliary ductal dilatation. Pancreas: Normal, without mass or ductal dilatation. Spleen: Normal in size, without focal abnormality. Adrenals/Urinary Tract: Normal adrenal glands. Too small to characterize upper pole right renal lesion. Normal left kidney, without hydronephrosis. Normal urinary bladder. Stomach/Bowel: Normal stomach, without wall thickening. Extensive colonic diverticulosis. Moderate wall thickening of and edema surrounding the sigmoid, consistent with diverticulitis. No free extraluminal gas or pericolonic fluid collection. Normal terminal ileum and appendix.  Normal small bowel. Vascular/Lymphatic: Aortic and branch vessel atherosclerosis. No abdominopelvic adenopathy. Upper normal sized nodes in the sigmoid mesocolon are likely reactive. Example at 7 mm on image 66/series 2. Reproductive: Hysterectomy.  No adnexal mass. Other: Trace left pelvic fluid is likely secondary to the colonic  inflammation. Mild pelvic floor laxity. Musculoskeletal: No acute osseous abnormality. IMPRESSION: 1. Moderate non complicated sigmoid diverticulitis. 2. Age advanced coronary artery atherosclerosis. Recommend assessment of coronary risk factors and consideration of medical therapy. 3.  Aortic Atherosclerosis (ICD10-I70.0). 4. Trace left pelvic fluid, likely secondary. Electronically Signed   By: Jeronimo Greaves M.D.   On: 09/29/2017 17:34    Procedures Procedures (including critical care time)  Medications Ordered in ED Medications  sodium chloride 0.9 % bolus 1,000 mL (1,000 mLs Intravenous New Bag/Given 09/29/17 1615)  ondansetron (ZOFRAN) injection 4 mg (4 mg Intravenous Given 09/29/17 1616)    morphine 4 MG/ML injection 4 mg (4 mg Intravenous Given 09/29/17 1617)  iopamidol (ISOVUE-300) 61 % injection 100 mL (100 mLs Intravenous Contrast Given 09/29/17 1703)     Initial Impression / Assessment and Plan / ED Course  I have reviewed the triage vital signs and the nursing notes.  Pertinent labs & imaging results that were available during my care of the patient were reviewed by me and considered in my medical decision making (see chart for details).  Clinical Course as of Sep 29 1746  Tue Sep 29, 2017  1738 IMPRESSION: 1. Moderate non complicated sigmoid diverticulitis. 2. Age advanced coronary artery atherosclerosis. Recommend assessment of coronary risk factors and consideration of medical therapy. 3. Aortic Atherosclerosis (ICD10-I70.0). 4. Trace left pelvic fluid, likely secondary. CT ABDOMEN PELVIS W CONTRAST [CG]    Clinical Course User Index [CG] Liberty Handy, PA-C   67 year old female with history of diverticulitis is here for her classic pain in the left lower quadrant for when she gets diverticulitis. Mild nausea. No systemic symptoms of infection. Exam is reassuring, she has focal tenderness in the left lower quadrant but no peritoneal signs. She does not look dehydrated. Afebrile. She has history of hysterectomy and bilateral oophorectomy, this is unlikely to be GU/GI in etiology.  Labwork reassuring without leukocytosis. Normal creatinine. Normal liver function tests. Urine does not look infected. CT scan shows uncomplicated diverticulitis without abscess or perforation. Pain and nausea was controlled in the emergency department. We'll attempt fluid challenge. We'll discharge with Augmentin and Zofran. She reports intolerance to Flagyl due to nausea. Recommended she call her primary care doctor in 24-48 hours for a check and in to ensure symptoms are improving. Discussed return percussion.  Final Clinical Impressions(s) / ED Diagnoses   Final diagnoses:   Diverticulitis    ED Discharge Orders        Ordered    amoxicillin-clavulanate (AUGMENTIN) 875-125 MG tablet  Every 12 hours     09/29/17 1744    ondansetron (ZOFRAN ODT) 4 MG disintegrating tablet  Every 8 hours PRN     09/29/17 1744       Liberty Handy, PA-C 09/29/17 1748    Pricilla Loveless, MD 09/30/17 [phone]

## 2017-09-29 NOTE — ED Triage Notes (Signed)
Patient states that she is having pain to her left lower stomach and states that she knows it diverticulitis. Was at the dr earlier and they sent her here for further evaluation

## 2017-09-29 NOTE — Discharge Instructions (Signed)
CT scan showed uncomplicated diverticulitis without abscess or perforation. We will treat this with Augmentin. Take Zofran for nausea. Stay well-hydrated. You may take [phone] mg of Tylenol for the pain. I recommend you call your primary care doctor in the next 24-48 hrs. to do a check-in and to ensure that your nausea, abdominal pain and symptoms are improving. Return to the emergency department for worsening abdominal pain, vomiting, fevers, chills.

## 2017-09-30 LAB — URINE CULTURE: CULTURE: NO GROWTH

## 2017-10-06 DIAGNOSIS — B349 Viral infection, unspecified: Secondary | ICD-10-CM | POA: Diagnosis not present

## 2017-10-28 DIAGNOSIS — E119 Type 2 diabetes mellitus without complications: Secondary | ICD-10-CM | POA: Diagnosis not present

## 2017-10-28 DIAGNOSIS — J45909 Unspecified asthma, uncomplicated: Secondary | ICD-10-CM | POA: Diagnosis not present

## 2017-10-28 DIAGNOSIS — I10 Essential (primary) hypertension: Secondary | ICD-10-CM | POA: Diagnosis not present

## 2017-10-28 DIAGNOSIS — K219 Gastro-esophageal reflux disease without esophagitis: Secondary | ICD-10-CM | POA: Diagnosis not present

## 2017-10-28 DIAGNOSIS — E7800 Pure hypercholesterolemia, unspecified: Secondary | ICD-10-CM | POA: Diagnosis not present

## 2017-10-28 DIAGNOSIS — E559 Vitamin D deficiency, unspecified: Secondary | ICD-10-CM | POA: Diagnosis not present

## 2017-11-03 DIAGNOSIS — L02214 Cutaneous abscess of groin: Secondary | ICD-10-CM | POA: Diagnosis not present

## 2018-01-05 DIAGNOSIS — S80861A Insect bite (nonvenomous), right lower leg, initial encounter: Secondary | ICD-10-CM | POA: Diagnosis not present

## 2018-01-05 DIAGNOSIS — L0291 Cutaneous abscess, unspecified: Secondary | ICD-10-CM | POA: Diagnosis not present

## 2018-05-03 DIAGNOSIS — I10 Essential (primary) hypertension: Secondary | ICD-10-CM | POA: Diagnosis not present

## 2018-05-03 DIAGNOSIS — E119 Type 2 diabetes mellitus without complications: Secondary | ICD-10-CM | POA: Diagnosis not present

## 2018-05-03 DIAGNOSIS — J45909 Unspecified asthma, uncomplicated: Secondary | ICD-10-CM | POA: Diagnosis not present

## 2018-05-03 DIAGNOSIS — K219 Gastro-esophageal reflux disease without esophagitis: Secondary | ICD-10-CM | POA: Diagnosis not present

## 2018-05-24 ENCOUNTER — Ambulatory Visit: Payer: Self-pay | Admitting: General Surgery

## 2018-05-24 DIAGNOSIS — K5792 Diverticulitis of intestine, part unspecified, without perforation or abscess without bleeding: Secondary | ICD-10-CM | POA: Diagnosis not present

## 2018-05-24 NOTE — H&P (Signed)
History of Present Illness Axel Filler MD; 05/24/2018 11:34 AM) The patient is a 67 year old female who presents with diverticulitis. Patient is a 67 year old female with a history of hypertension, diabetes, hypercholesterolemia, remote history of DVT, who comes in with recurrent bouts of diverticulitis. Patient states over the last year she's had 4 bouts of diverticulitis or treated as an outpatient. Patient states that the pain usually was a left lower quadrant. She states this was associated with constipation. Patient has had multiple CT scans reveal lower sigmoid diverticulitis. Most recent CT scan was in February 2019. I did review this personally.  Patient did have a recent colonoscopy 2 years ago by Dr. Bosie Clos and had 2 polyps that were benign.  Patient's had a previous laparoscopic cholecystectomy, open hysterectomy via Pfannenstiel incision   Past Surgical History (Hadelyn Massenburg, LPN; 16/08/958 45:40 AM) Colon Polyp Removal - Colonoscopy  Gallbladder Surgery - Laparoscopic  Hysterectomy (not due to cancer) - Complete   Diagnostic Studies History (Hadelyn Massenburg, LPN; 98/08/1912 78:29 AM) Colonoscopy  1-5 years ago Mammogram  within last year  Allergies [facility], LPN; 56/09/1306 65:78 AM) No Known Drug Allergies [05/24/2018]: Allergies Reconciled   Medication History (Hadelyn Massenburg, LPN; 46/04/6294 28:41 AM) Lisinopril (40MG  Tablet, Oral) Active. hydroCHLOROthiazide (12.5MG  Tablet, Oral) Active. Atenolol (25MG  Tablet, Oral) Active. Zoloft (100MG  Tablet, Oral) Active. Aciphex (20MG  Tablet DR, Oral) Active. Aspirin (81MG  Tablet Chewable, Oral) Active. Aspirin (81MG  Tablet DR, Oral) Active. Potassium (99MG  Tablet, Oral) Active. Vitamin D3 (400UNIT Tablet, Oral) Active. Vitamin D3 (1000UNIT Capsule, Oral) Active. Magnesium (250MG  Tablet, Oral) Active. Fiber Adult Gummies (2GM Tablet Chewable, Oral) Active.  Social History  (Hadelyn Massenburg, LPN; 32/11/4008 27:25 AM) Caffeine use  Coffee. No alcohol use  No drug use  Tobacco use  Former smoker.  Family History (Hadelyn Massenburg, LPN; 36/01/4402 47:42 AM) Arthritis  Mother. Diabetes Mellitus  Father, Sister. Heart Disease  Mother, Sister. Hypertension  Daughter, Mother, Sister. Kidney Disease  Daughter, Mother, Sister. Migraine Headache  Sister.  Pregnancy / Birth History Alberteen Spindle, LPN; 59/12/6385 56:43 AM) Age at menarche  13 years. Age of menopause  <45 Gravida  2 Maternal age  22-20 Para  2  Other Problems (Hadelyn Massenburg, LPN; 32/04/5187 41:66 AM) Asthma  Cholelithiasis  Depression  Diabetes Mellitus  Diverticulosis  Gastroesophageal Reflux Disease  High blood pressure  Hypercholesterolemia  Oophorectomy  Bilateral. Pulmonary Embolism / Blood Clot in Legs     Review of Systems (Hadelyn Massenburg LPN; 01/19/159 10:93 AM) General Present- Fatigue. Not Present- Appetite Loss, Chills, Fever, Night Sweats, Weight Gain and Weight Loss. Skin Not Present- Change in Wart/Mole, Dryness, Hives, Jaundice, New Lesions, Non-Healing Wounds, Rash and Ulcer. HEENT Not Present- Earache, Hearing Loss, Hoarseness, Nose Bleed, Oral Ulcers, Ringing in the Ears, Seasonal Allergies, Sinus Pain, Sore Throat, Visual Disturbances, Wears glasses/contact lenses and Yellow Eyes. Respiratory Not Present- Bloody sputum, Chronic Cough, Difficulty Breathing, Snoring and Wheezing. Breast Not Present- Breast Mass, Breast Pain, Nipple Discharge and Skin Changes. Cardiovascular Not Present- Chest Pain, Difficulty Breathing Lying Down, Leg Cramps, Palpitations, Rapid Heart Rate, Shortness of Breath and Swelling of Extremities. Gastrointestinal Present- Abdominal Pain, Bloating and Constipation. Not Present- Bloody Stool, Change in Bowel Habits, Chronic diarrhea, Difficulty Swallowing, Excessive gas, Gets full quickly at meals,  Hemorrhoids, Indigestion, Nausea, Rectal Pain and Vomiting. Female Genitourinary Not Present- Frequency, Nocturia, Painful Urination, Pelvic Pain and Urgency. Musculoskeletal Not Present- Back Pain, Joint Pain, Joint Stiffness, Muscle Pain, Muscle Weakness and Swelling of Extremities. Neurological Not  Present- Decreased Memory, Fainting, Headaches, Numbness, Seizures, Tingling, Tremor, Trouble walking and Weakness. Psychiatric Not Present- Anxiety, Bipolar, Change in Sleep Pattern, Depression, Fearful and Frequent crying. Endocrine Not Present- Cold Intolerance, Excessive Hunger, Hair Changes, Heat Intolerance, Hot flashes and New Diabetes. Hematology Not Present- Blood Thinners, Easy Bruising, Excessive bleeding, Gland problems, HIV and Persistent Infections.  Vitals (Hadelyn Massenburg LPN; 16/08/958 45:40 AM) 05/24/2018 11:16 AM Weight: 179.8 lb Height: 58in Body Surface Area: 1.74 m Body Mass Index: 37.58 kg/m  Temp.: 98.39F  Pulse: 77 (Regular)  Resp.: 18 (Unlabored)  P.OX: 99% (Room air)       Physical Exam Axel Filler MD; 05/24/2018 11:34 AM) The physical exam findings are as follows: Note:Constitutional: No acute distress, conversant, appears stated age  Eyes: Anicteric sclerae, moist conjunctiva, no lid lag  Neck: No thyromegaly, trachea midline, no cervical lymphadenopathy  Lungs: Clear to auscultation biilaterally, normal respiratory effot  Cardiovascular: regular rate & rhythm, no murmurs, no peripheal edema, pedal pulses 2+  GI: Soft, no masses or hepatosplenomegaly, non-tender to palpation, laparoscopic cholecystectomy incisions, lower Pfannenstiel incision  MSK: Normal gait, no clubbing cyanosis, edema  Skin: No rashes, palpation reveals normal skin turgor  Psychiatric: Appropriate judgment and insight, oriented to person, place, and time    Assessment & Plan Axel Filler MD; 05/24/2018 11:37 AM) DIVERTICULITIS (K57.92) Impression:  Patient is a 67 year old female with a history of hypertension, diabetes, hypercholesterolemia, remote history of DVT and recurrent bouts of diverticulitis.  1. The patient elected to proceed to the operating room for laparoscopic sigmoid colon resection. 2. We will have her enrolled in the ERAS class. 3. Did discuss with her the risks and benefits of the procedure to include but are limited to: Infection, bleeding, damage to structures, possible recurrence, possible need for ostomy. The patient was understanding and wishes to proceed.

## 2018-06-25 DIAGNOSIS — Z23 Encounter for immunization: Secondary | ICD-10-CM | POA: Diagnosis not present

## 2018-07-16 NOTE — Pre-Procedure Instructions (Addendum)
Stephanie [name]  07/16/2018      [id number] - [place], [place] - [address] [place] [id number] Phone: [phone] Fax: [phone]    Your procedure is scheduled on Monday December 9.  [facility]:45 [facility].M.  Call this number if you have problems the morning of surgery:  [phone]   Remember:  Do not eat or drink after midnight.  You may drink clear liquids until 10:45 AM (3 hours prior to surgery) .  Clear liquids allowed are:    Water, Clear Tea, Black Coffee only and Gatorade   DRINK 2 ensure pre-surgery drinks the night before surgery  DRINK 1 ensure pre-surgery drink the morning of surgery by 10:45 AM (3 hours prior to surgery)    Take these medicines the morning of surgery with A SIP OF WATER:   Atenolol (Tenormin) Pantoprazole (protonix) Rabeprazole (Aciphex) Sertraline (zoloft)  Zofran if needed Albuterol if needed (please bring to hospital with you)  7 days prior to surgery STOP taking any Aleve, Naproxen, Ibuprofen, Motrin, Advil, Goody's, BC's, all herbal medications, fish oil, and all vitamins  FOLLOW your surgeon's instructions on stopping Aspirin. If no instructions were given, please call your surgeon's office.      Do not wear jewelry, make-up or nail polish.  Do not wear lotions, powders, or perfumes, or deodorant.  Do not shave 48 hours prior to surgery.  Men may shave face and neck.  Do not bring valuables to the hospital.  [facility] is not responsible for any belongings or valuables.  Contacts, dentures or bridgework may not be worn into surgery.  Leave your suitcase in the car.  After surgery it may be brought to your room.  For patients admitted to the hospital, discharge time will be determined by your treatment team.  Patients discharged the day of surgery will not be allowed to drive home.   Special instructions:    [facility]- Preparing For  Surgery  Before surgery, you can play an important role. Because skin is not sterile, your skin needs to be as free of germs as possible. You can reduce the number of germs on your skin by washing with CHG (chlorahexidine gluconate) Soap before surgery.  CHG is an antiseptic cleaner which kills germs and bonds with the skin to continue killing germs even after washing.    Oral Hygiene is also important to reduce your risk of infection.  Remember - BRUSH YOUR TEETH THE MORNING OF SURGERY WITH YOUR REGULAR TOOTHPASTE  Please do not use if you have an allergy to CHG or antibacterial soaps. If your skin becomes reddened/irritated stop using the CHG.  Do not shave (including legs and underarms) for at least 48 hours prior to first CHG shower. It is OK to shave your face.  Please follow these instructions carefully.   1. Shower the NIGHT BEFORE SURGERY and the MORNING OF SURGERY with CHG.   2. If you chose to wash your hair, wash your hair first as usual with your normal shampoo.  3. After you shampoo, rinse your hair and body thoroughly to remove the shampoo.  4. Use CHG as you would any other liquid soap. You can apply CHG directly to the skin and wash gently with a scrungie or a clean washcloth.   5. Apply the CHG Soap to your body ONLY FROM THE NECK DOWN.  Do not use on open wounds or open  sores. Avoid contact with your eyes, ears, mouth and genitals (private parts). Wash Face and genitals (private parts)  with your normal soap.  6. Wash thoroughly, paying special attention to the area where your surgery will be performed.  7. Thoroughly rinse your body with warm water from the neck down.  8. DO NOT shower/wash with your normal soap after using and rinsing off the CHG Soap.  9. Pat yourself dry with a CLEAN TOWEL.  10. Wear CLEAN PAJAMAS to bed the night before surgery, wear comfortable clothes the morning of surgery  11. Place CLEAN SHEETS on your bed the night of your first shower and  DO NOT SLEEP WITH PETS.    Day of Surgery:  Do not apply any deodorants/lotions.  Please wear clean clothes to the hospital/surgery center.   Remember to brush your teeth WITH YOUR REGULAR TOOTHPASTE.    Please read over the following fact sheets that you were given. Coughing and Deep Breathing and Surgical Site Infection Prevention

## 2018-07-19 ENCOUNTER — Encounter (HOSPITAL_COMMUNITY): Payer: Self-pay

## 2018-07-19 ENCOUNTER — Other Ambulatory Visit: Payer: Self-pay

## 2018-07-19 ENCOUNTER — Encounter (HOSPITAL_COMMUNITY)
Admission: RE | Admit: 2018-07-19 | Discharge: 2018-07-19 | Disposition: A | Discharge status: Home / Self Care | Payer: BLUE CROSS/BLUE SHIELD | Source: Ambulatory Visit | Attending: General Surgery | Admitting: General Surgery

## 2018-07-19 DIAGNOSIS — Z01818 Encounter for other preprocedural examination: Secondary | ICD-10-CM | POA: Diagnosis not present

## 2018-07-19 HISTORY — DX: Major depressive disorder, single episode, unspecified: F32.9

## 2018-07-19 HISTORY — DX: Unspecified hearing loss, unspecified ear: H91.90

## 2018-07-19 HISTORY — DX: Anxiety disorder, unspecified: F41.9

## 2018-07-19 HISTORY — DX: Depression, unspecified: F32.A

## 2018-07-19 LAB — BASIC METABOLIC PANEL
Anion gap: 7 (ref 5–15)
BUN: 21 mg/dL (ref 8–23)
CO2: 28 mmol/L (ref 22–32)
Calcium: 9.7 mg/dL (ref 8.9–10.3)
Chloride: 101 mmol/L (ref 98–111)
Creatinine, Ser: 0.77 mg/dL (ref 0.44–1.00)
GFR calc non Af Amer: >60 mL/min (ref >60)
Glucose, Bld: 101 mg/dL — ABNORMAL HIGH (ref 70–99)
Potassium: 4.2 mmol/L (ref 3.5–5.1)
Sodium: 136 mmol/L (ref 135–145)

## 2018-07-19 LAB — HEMOGLOBIN A1C
Hgb A1c MFr Bld: 4.9 % (ref 4.8–5.6)
MEAN PLASMA GLUCOSE: 93.93 mg/dL

## 2018-07-19 LAB — CBC
HEMATOCRIT: 41.4 % (ref 36.0–46.0)
HEMOGLOBIN: 13.2 g/dL (ref 12.0–15.0)
MCH: 28.8 pg (ref 26.0–34.0)
MCHC: 31.9 g/dL (ref 30.0–36.0)
MCV: 90.4 fL (ref 80.0–100.0)
Platelets: 287 10*3/uL (ref 150–400)
RBC: 4.58 MIL/uL (ref 3.87–5.11)
RDW: 13.5 % (ref 11.5–15.5)
WBC: 5 10*3/uL (ref 4.0–10.5)
nRBC: 0 % (ref 0.0–0.2)

## 2018-07-19 LAB — GLUCOSE, CAPILLARY: Glucose-Capillary: 94 mg/dL (ref 70–99)

## 2018-07-19 NOTE — Progress Notes (Signed)
PCP is Dr. Darlin DropS Meyers @ [facility]. LOV 04/2018 Currently denies any murmur, cp, sob. She did say that after her hysterectomy, she did develop a DVT  (happened yrs ago - no issues now) Has been on aspirin Last A1C 5.3 in 04/2018 Pre ensure bottles given and patient is aware of bowel prep, however when I asked about the antibiotics she was to take, she stated that Dr. Derrell Lollingamirez told her not to worry about taking those. (I confirmed this with the office staff)

## 2018-07-26 ENCOUNTER — Inpatient Hospital Stay (HOSPITAL_COMMUNITY): Payer: BLUE CROSS/BLUE SHIELD | Admitting: Registered Nurse

## 2018-07-26 ENCOUNTER — Inpatient Hospital Stay (HOSPITAL_COMMUNITY)
Admission: RE | Admit: 2018-07-26 | Discharge: 2018-07-29 | DRG: 331 | Disposition: A | Discharge status: Home Health | Payer: BLUE CROSS/BLUE SHIELD | Attending: General Surgery | Admitting: General Surgery

## 2018-07-26 ENCOUNTER — Encounter (HOSPITAL_COMMUNITY)
Admission: RE | Disposition: A | Discharge status: Home Health | Payer: Self-pay | Source: Home / Self Care | Attending: General Surgery

## 2018-07-26 ENCOUNTER — Encounter (HOSPITAL_COMMUNITY): Payer: Self-pay | Admitting: *Deleted

## 2018-07-26 ENCOUNTER — Other Ambulatory Visit: Payer: Self-pay

## 2018-07-26 DIAGNOSIS — Z90710 Acquired absence of both cervix and uterus: Secondary | ICD-10-CM

## 2018-07-26 DIAGNOSIS — K219 Gastro-esophageal reflux disease without esophagitis: Secondary | ICD-10-CM | POA: Diagnosis present

## 2018-07-26 DIAGNOSIS — Z9049 Acquired absence of other specified parts of digestive tract: Secondary | ICD-10-CM | POA: Diagnosis not present

## 2018-07-26 DIAGNOSIS — Z86718 Personal history of other venous thrombosis and embolism: Secondary | ICD-10-CM

## 2018-07-26 DIAGNOSIS — Z87891 Personal history of nicotine dependence: Secondary | ICD-10-CM | POA: Diagnosis not present

## 2018-07-26 DIAGNOSIS — E7800 Pure hypercholesterolemia, unspecified: Secondary | ICD-10-CM | POA: Diagnosis not present

## 2018-07-26 DIAGNOSIS — J45909 Unspecified asthma, uncomplicated: Secondary | ICD-10-CM | POA: Diagnosis not present

## 2018-07-26 DIAGNOSIS — I10 Essential (primary) hypertension: Secondary | ICD-10-CM | POA: Diagnosis present

## 2018-07-26 DIAGNOSIS — E119 Type 2 diabetes mellitus without complications: Secondary | ICD-10-CM | POA: Diagnosis present

## 2018-07-26 DIAGNOSIS — K5730 Diverticulosis of large intestine without perforation or abscess without bleeding: Secondary | ICD-10-CM | POA: Diagnosis not present

## 2018-07-26 DIAGNOSIS — Z8249 Family history of ischemic heart disease and other diseases of the circulatory system: Secondary | ICD-10-CM | POA: Diagnosis not present

## 2018-07-26 DIAGNOSIS — K5732 Diverticulitis of large intestine without perforation or abscess without bleeding: Secondary | ICD-10-CM | POA: Diagnosis not present

## 2018-07-26 DIAGNOSIS — Z833 Family history of diabetes mellitus: Secondary | ICD-10-CM

## 2018-07-26 DIAGNOSIS — K660 Peritoneal adhesions (postprocedural) (postinfection): Secondary | ICD-10-CM | POA: Diagnosis not present

## 2018-07-26 DIAGNOSIS — F329 Major depressive disorder, single episode, unspecified: Secondary | ICD-10-CM | POA: Diagnosis present

## 2018-07-26 DIAGNOSIS — K529 Noninfective gastroenteritis and colitis, unspecified: Secondary | ICD-10-CM | POA: Diagnosis not present

## 2018-07-26 DIAGNOSIS — K5792 Diverticulitis of intestine, part unspecified, without perforation or abscess without bleeding: Secondary | ICD-10-CM | POA: Diagnosis not present

## 2018-07-26 HISTORY — PX: COLECTOMY: SHX59

## 2018-07-26 HISTORY — DX: Acute embolism and thrombosis of unspecified deep veins of unspecified lower extremity: I82.409

## 2018-07-26 HISTORY — DX: Type 2 diabetes mellitus without complications: E11.9

## 2018-07-26 HISTORY — DX: Family history of other specified conditions: Z84.89

## 2018-07-26 HISTORY — PX: SIGMOIDOSCOPY: SHX6686

## 2018-07-26 HISTORY — DX: Gastro-esophageal reflux disease without esophagitis: K21.9

## 2018-07-26 HISTORY — PX: COLON RESECTION: SHX5231

## 2018-07-26 HISTORY — DX: Other asthma: J45.998

## 2018-07-26 LAB — TYPE AND SCREEN
ABO/RH(D): A POS
Antibody Screen: NEGATIVE

## 2018-07-26 LAB — GLUCOSE, CAPILLARY: Glucose-Capillary: 99 mg/dL (ref 70–99)

## 2018-07-26 LAB — ABO/RH: ABO/RH(D): A POS

## 2018-07-26 SURGERY — LAPAROSCOPIC SIGMOID COLON RESECTION
Anesthesia: General | Site: Rectum

## 2018-07-26 MED ORDER — CHLORHEXIDINE GLUCONATE CLOTH 2 % EX PADS: 6.0000 | MEDICATED_PAD | Freq: Once | CUTANEOUS | Status: DC

## 2018-07-26 MED ORDER — SUGAMMADEX SODIUM 200 MG/2ML IV SOLN
INTRAVENOUS | Status: DC | PRN
  Administered 2018-07-26: 158.8 mg via INTRAVENOUS

## 2018-07-26 MED ORDER — LIDOCAINE 2% (20 MG/ML) 5 ML SYRINGE
INTRAMUSCULAR | Status: DC | PRN
  Administered 2018-07-26: 60 mg via INTRAVENOUS

## 2018-07-26 MED ORDER — 0.9 % SODIUM CHLORIDE (POUR BTL) OPTIME
TOPICAL | Status: DC | PRN
  Administered 2018-07-26 (×2): 1000 mL

## 2018-07-26 MED ORDER — ALVIMOPAN 12 MG PO CAPS
12.0000 mg | ORAL_CAPSULE | Freq: Two times a day (BID) | ORAL | Status: DC
  Administered 2018-07-27: 12 mg via ORAL
  Filled 2018-07-26: qty 1

## 2018-07-26 MED ORDER — FENTANYL CITRATE (PF) 250 MCG/5ML IJ SOLN
INTRAMUSCULAR | Status: DC | PRN
  Administered 2018-07-26 (×4): 50 ug via INTRAVENOUS
  Administered 2018-07-26: 100 ug via INTRAVENOUS

## 2018-07-26 MED ORDER — KETAMINE HCL 10 MG/ML IJ SOLN
INTRAMUSCULAR | Status: DC | PRN
  Administered 2018-07-26: 10 mg via INTRAVENOUS
  Administered 2018-07-26: 20 mg via INTRAVENOUS
  Administered 2018-07-26 (×2): 10 mg via INTRAVENOUS

## 2018-07-26 MED ORDER — ZOLPIDEM TARTRATE 5 MG PO TABS
5.0000 mg | ORAL_TABLET | Freq: Every day | ORAL | Status: DC
  Administered 2018-07-26 – 2018-07-28 (×3): 5 mg via ORAL
  Filled 2018-07-26 (×3): qty 1

## 2018-07-26 MED ORDER — BUPIVACAINE-EPINEPHRINE (PF) 0.25% -1:200000 IJ SOLN
INTRAMUSCULAR | Status: AC
  Filled 2018-07-26: qty 30

## 2018-07-26 MED ORDER — DEXAMETHASONE SODIUM PHOSPHATE 10 MG/ML IJ SOLN
INTRAMUSCULAR | Status: DC | PRN
  Administered 2018-07-26: 5 mg via INTRAVENOUS

## 2018-07-26 MED ORDER — GABAPENTIN 300 MG PO CAPS
ORAL_CAPSULE | ORAL | Status: AC
  Administered 2018-07-26: 300 mg via ORAL
  Filled 2018-07-26: qty 1

## 2018-07-26 MED ORDER — HEPARIN SODIUM (PORCINE) 5000 UNIT/ML IJ SOLN
5000.0000 [IU] | Freq: Once | INTRAMUSCULAR | Status: AC
  Administered 2018-07-26: 5000 [IU] via SUBCUTANEOUS

## 2018-07-26 MED ORDER — ACETAMINOPHEN 500 MG PO TABS
ORAL_TABLET | ORAL | Status: AC
  Administered 2018-07-26: 1000 mg via ORAL
  Filled 2018-07-26: qty 2

## 2018-07-26 MED ORDER — FENTANYL CITRATE (PF) 250 MCG/5ML IJ SOLN
INTRAMUSCULAR | Status: AC
  Filled 2018-07-26: qty 5

## 2018-07-26 MED ORDER — MIDAZOLAM HCL 2 MG/2ML IJ SOLN
INTRAMUSCULAR | Status: AC
  Filled 2018-07-26: qty 2

## 2018-07-26 MED ORDER — GABAPENTIN 300 MG PO CAPS
300.0000 mg | ORAL_CAPSULE | ORAL | Status: AC
  Administered 2018-07-26: 300 mg via ORAL

## 2018-07-26 MED ORDER — ALVIMOPAN 12 MG PO CAPS
12.0000 mg | ORAL_CAPSULE | ORAL | Status: AC
  Administered 2018-07-26: 12 mg via ORAL

## 2018-07-26 MED ORDER — PROPOFOL 10 MG/ML IV BOLUS
INTRAVENOUS | Status: AC
  Filled 2018-07-26: qty 20

## 2018-07-26 MED ORDER — POLYVINYL ALCOHOL 1.4 % OP SOLN
1.0000 [drp] | OPHTHALMIC | Status: DC | PRN
  Administered 2018-07-26: 1 [drp] via OPHTHALMIC
  Filled 2018-07-26: qty 15

## 2018-07-26 MED ORDER — BUPIVACAINE-EPINEPHRINE 0.25% -1:200000 IJ SOLN
INTRAMUSCULAR | Status: DC | PRN
  Administered 2018-07-26: 10 mL

## 2018-07-26 MED ORDER — ENSURE SURGERY PO LIQD
237.0000 mL | Freq: Two times a day (BID) | ORAL | Status: DC
  Administered 2018-07-27 – 2018-07-28 (×4): 237 mL via ORAL
  Filled 2018-07-26 (×9): qty 237

## 2018-07-26 MED ORDER — LACTATED RINGERS IV SOLN
INTRAVENOUS | Status: DC
  Administered 2018-07-26 (×3): via INTRAVENOUS

## 2018-07-26 MED ORDER — GLYCOPYRROLATE PF 0.2 MG/ML IJ SOSY
PREFILLED_SYRINGE | INTRAMUSCULAR | Status: AC
  Filled 2018-07-26: qty 1

## 2018-07-26 MED ORDER — MIDAZOLAM HCL 2 MG/2ML IJ SOLN
INTRAMUSCULAR | Status: DC | PRN
  Administered 2018-07-26: 2 mg via INTRAVENOUS

## 2018-07-26 MED ORDER — ONDANSETRON HCL 4 MG/2ML IJ SOLN
INTRAMUSCULAR | Status: DC | PRN
  Administered 2018-07-26: 4 mg via INTRAVENOUS

## 2018-07-26 MED ORDER — DEXMEDETOMIDINE HCL IN NACL 200 MCG/50ML IV SOLN
INTRAVENOUS | Status: DC | PRN
  Administered 2018-07-26: 12 ug via INTRAVENOUS

## 2018-07-26 MED ORDER — ALBUTEROL SULFATE (2.5 MG/3ML) 0.083% IN NEBU: 3.0000 mL | INHALATION_SOLUTION | Freq: Four times a day (QID) | RESPIRATORY_TRACT | Status: DC | PRN

## 2018-07-26 MED ORDER — ROCURONIUM BROMIDE 50 MG/5ML IV SOSY
PREFILLED_SYRINGE | INTRAVENOUS | Status: AC
  Filled 2018-07-26: qty 5

## 2018-07-26 MED ORDER — ONDANSETRON HCL 4 MG PO TABS: 4.0000 mg | ORAL_TABLET | Freq: Four times a day (QID) | ORAL | Status: DC | PRN

## 2018-07-26 MED ORDER — CEFOTETAN DISODIUM-DEXTROSE 2-2.08 GM-%(50ML) IV SOLR
INTRAVENOUS | Status: AC
  Filled 2018-07-26: qty 50

## 2018-07-26 MED ORDER — ONDANSETRON HCL 4 MG/2ML IJ SOLN
INTRAMUSCULAR | Status: AC
  Filled 2018-07-26: qty 2

## 2018-07-26 MED ORDER — PROPOFOL 500 MG/50ML IV EMUL
INTRAVENOUS | Status: DC | PRN
  Administered 2018-07-26: 25 ug/kg/min via INTRAVENOUS

## 2018-07-26 MED ORDER — DEXMEDETOMIDINE HCL IN NACL 200 MCG/50ML IV SOLN
INTRAVENOUS | Status: AC
  Filled 2018-07-26: qty 50

## 2018-07-26 MED ORDER — SODIUM CHLORIDE 0.9 % IV SOLN
INTRAVENOUS | Status: DC | PRN
  Administered 2018-07-26: 50 ug/min via INTRAVENOUS

## 2018-07-26 MED ORDER — CELECOXIB 200 MG PO CAPS
200.0000 mg | ORAL_CAPSULE | ORAL | Status: AC
  Administered 2018-07-26: 200 mg via ORAL

## 2018-07-26 MED ORDER — PROPOFOL 10 MG/ML IV BOLUS
INTRAVENOUS | Status: DC | PRN
  Administered 2018-07-26: 110 mg via INTRAVENOUS

## 2018-07-26 MED ORDER — KETAMINE HCL 50 MG/5ML IJ SOSY
PREFILLED_SYRINGE | INTRAMUSCULAR | Status: AC
  Filled 2018-07-26: qty 5

## 2018-07-26 MED ORDER — ALVIMOPAN 12 MG PO CAPS
ORAL_CAPSULE | ORAL | Status: AC
  Administered 2018-07-26: 12 mg via ORAL
  Filled 2018-07-26: qty 1

## 2018-07-26 MED ORDER — ROCURONIUM BROMIDE 10 MG/ML (PF) SYRINGE
PREFILLED_SYRINGE | INTRAVENOUS | Status: DC | PRN
  Administered 2018-07-26: 30 mg via INTRAVENOUS
  Administered 2018-07-26: 50 mg via INTRAVENOUS

## 2018-07-26 MED ORDER — SURGILUBE EX GEL
CUTANEOUS | Status: DC | PRN
  Administered 2018-07-26: 1 via TOPICAL

## 2018-07-26 MED ORDER — ENOXAPARIN SODIUM 40 MG/0.4ML ~~LOC~~ SOLN
40.0000 mg | SUBCUTANEOUS | Status: DC
  Administered 2018-07-27 – 2018-07-28 (×2): 40 mg via SUBCUTANEOUS
  Filled 2018-07-26 (×2): qty 0.4

## 2018-07-26 MED ORDER — DEXTROSE-NACL 5-0.9 % IV SOLN
INTRAVENOUS | Status: DC
  Administered 2018-07-26: 19:00:00 via INTRAVENOUS

## 2018-07-26 MED ORDER — PHENYLEPHRINE 40 MCG/ML (10ML) SYRINGE FOR IV PUSH (FOR BLOOD PRESSURE SUPPORT)
PREFILLED_SYRINGE | INTRAVENOUS | Status: AC
  Filled 2018-07-26: qty 10

## 2018-07-26 MED ORDER — HYDROCHLOROTHIAZIDE 12.5 MG PO CAPS
12.5000 mg | ORAL_CAPSULE | Freq: Every day | ORAL | Status: DC
  Administered 2018-07-27 – 2018-07-28 (×2): 12.5 mg via ORAL
  Filled 2018-07-26 (×2): qty 1

## 2018-07-26 MED ORDER — KETOROLAC TROMETHAMINE 15 MG/ML IJ SOLN
15.0000 mg | Freq: Four times a day (QID) | INTRAMUSCULAR | Status: DC | PRN
  Administered 2018-07-26 – 2018-07-27 (×4): 15 mg via INTRAVENOUS
  Filled 2018-07-26 (×4): qty 1

## 2018-07-26 MED ORDER — ATENOLOL 25 MG PO TABS
25.0000 mg | ORAL_TABLET | Freq: Every day | ORAL | Status: DC
  Administered 2018-07-27 – 2018-07-28 (×2): 25 mg via ORAL
  Filled 2018-07-26 (×2): qty 1

## 2018-07-26 MED ORDER — CELECOXIB 200 MG PO CAPS
ORAL_CAPSULE | ORAL | Status: AC
  Administered 2018-07-26: 200 mg via ORAL
  Filled 2018-07-26: qty 1

## 2018-07-26 MED ORDER — SACCHAROMYCES BOULARDII 250 MG PO CAPS
250.0000 mg | ORAL_CAPSULE | Freq: Two times a day (BID) | ORAL | Status: DC
  Administered 2018-07-26 – 2018-07-28 (×5): 250 mg via ORAL
  Filled 2018-07-26 (×5): qty 1

## 2018-07-26 MED ORDER — EPHEDRINE 5 MG/ML INJ
INTRAVENOUS | Status: AC
  Filled 2018-07-26: qty 10

## 2018-07-26 MED ORDER — GABAPENTIN 300 MG PO CAPS
300.0000 mg | ORAL_CAPSULE | Freq: Two times a day (BID) | ORAL | Status: DC
  Administered 2018-07-26 – 2018-07-28 (×5): 300 mg via ORAL
  Filled 2018-07-26 (×5): qty 1

## 2018-07-26 MED ORDER — LISINOPRIL 40 MG PO TABS
40.0000 mg | ORAL_TABLET | Freq: Every day | ORAL | Status: DC
  Administered 2018-07-27 – 2018-07-28 (×2): 40 mg via ORAL
  Filled 2018-07-26 (×2): qty 1

## 2018-07-26 MED ORDER — ACETAMINOPHEN 500 MG PO TABS
1000.0000 mg | ORAL_TABLET | ORAL | Status: AC
  Administered 2018-07-26: 1000 mg via ORAL

## 2018-07-26 MED ORDER — HEPARIN SODIUM (PORCINE) 5000 UNIT/ML IJ SOLN
INTRAMUSCULAR | Status: AC
  Administered 2018-07-26: 5000 [IU] via SUBCUTANEOUS
  Filled 2018-07-26: qty 1

## 2018-07-26 MED ORDER — PHENYLEPHRINE 40 MCG/ML (10ML) SYRINGE FOR IV PUSH (FOR BLOOD PRESSURE SUPPORT)
PREFILLED_SYRINGE | INTRAVENOUS | Status: DC | PRN
  Administered 2018-07-26 (×5): 80 ug via INTRAVENOUS

## 2018-07-26 MED ORDER — SODIUM CHLORIDE 0.9 % IR SOLN
Status: DC | PRN
  Administered 2018-07-26: 1000 mL

## 2018-07-26 MED ORDER — CEFOTETAN DISODIUM-DEXTROSE 2-2.08 GM-%(50ML) IV SOLR
2.0000 g | INTRAVENOUS | Status: AC
  Administered 2018-07-26: 2 g via INTRAVENOUS

## 2018-07-26 MED ORDER — HYDROMORPHONE HCL 1 MG/ML IJ SOLN: 0.2500 mg | INTRAMUSCULAR | Status: DC | PRN

## 2018-07-26 MED ORDER — PANTOPRAZOLE SODIUM 20 MG PO TBEC
20.0000 mg | DELAYED_RELEASE_TABLET | Freq: Every day | ORAL | Status: DC
  Administered 2018-07-27 – 2018-07-28 (×2): 20 mg via ORAL
  Filled 2018-07-26 (×2): qty 1

## 2018-07-26 MED ORDER — ONDANSETRON HCL 4 MG/2ML IJ SOLN
4.0000 mg | Freq: Four times a day (QID) | INTRAMUSCULAR | Status: DC | PRN
  Administered 2018-07-26: 4 mg via INTRAVENOUS
  Filled 2018-07-26: qty 2

## 2018-07-26 MED ORDER — LIDOCAINE 2% (20 MG/ML) 5 ML SYRINGE
INTRAMUSCULAR | Status: AC
  Filled 2018-07-26: qty 5

## 2018-07-26 MED ORDER — STERILE WATER FOR IRRIGATION IR SOLN
Status: DC | PRN
  Administered 2018-07-26: 1000 mL

## 2018-07-26 SURGICAL SUPPLY — 81 items
APPLIER CLIP 5 13 M/L LIGAMAX5 (MISCELLANEOUS)
APPLIER CLIP ROT 10 11.4 M/L (STAPLE)
BLADE CLIPPER SURG (BLADE) ×3 IMPLANT
CANISTER SUCT 3000ML PPV (MISCELLANEOUS) ×3 IMPLANT
CELLS DAT CNTRL 66122 CELL SVR (MISCELLANEOUS) ×2 IMPLANT
CHLORAPREP W/TINT 26ML (MISCELLANEOUS) ×3 IMPLANT
CLIP APPLIE 5 13 M/L LIGAMAX5 (MISCELLANEOUS) IMPLANT
CLIP APPLIE ROT 10 11.4 M/L (STAPLE) IMPLANT
COVER MAYO STAND STRL (DRAPES) ×6 IMPLANT
COVER SURGICAL LIGHT HANDLE (MISCELLANEOUS) ×6 IMPLANT
COVER WAND RF STERILE (DRAPES) ×3 IMPLANT
CUTTER FLEX LINEAR 45M (STAPLE) ×3 IMPLANT
DEFOGGER SCOPE WARMER CLEARIFY (MISCELLANEOUS) ×3 IMPLANT
DRAPE HALF SHEET 40X57 (DRAPES) ×3 IMPLANT
DRAPE UTILITY XL STRL (DRAPES) ×6 IMPLANT
DRAPE WARM FLUID 44X44 (DRAPE) ×3 IMPLANT
DRSG OPSITE POSTOP 4X10 (GAUZE/BANDAGES/DRESSINGS) IMPLANT
DRSG OPSITE POSTOP 4X6 (GAUZE/BANDAGES/DRESSINGS) ×3 IMPLANT
DRSG OPSITE POSTOP 4X8 (GAUZE/BANDAGES/DRESSINGS) IMPLANT
DRSG TEGADERM 2-3/8X2-3/4 SM (GAUZE/BANDAGES/DRESSINGS) ×15 IMPLANT
ELECT BLADE 6.5 EXT (BLADE) ×3 IMPLANT
ELECT CAUTERY BLADE 6.4 (BLADE) ×3 IMPLANT
ELECT REM PT RETURN 9FT ADLT (ELECTROSURGICAL) ×3
ELECTRODE REM PT RTRN 9FT ADLT (ELECTROSURGICAL) ×2 IMPLANT
ENDOLOOP SUT PDS II  0 18 (SUTURE) ×1
ENDOLOOP SUT PDS II 0 18 (SUTURE) ×2 IMPLANT
GAUZE SPONGE 2X2 8PLY STRL LF (GAUZE/BANDAGES/DRESSINGS) ×2 IMPLANT
GEL ULTRASOUND 20GR AQUASONIC (MISCELLANEOUS) IMPLANT
GLOVE BIO SURGEON STRL SZ7.5 (GLOVE) ×6 IMPLANT
GLOVE BIOGEL PI IND STRL 8 (GLOVE) ×4 IMPLANT
GLOVE BIOGEL PI INDICATOR 8 (GLOVE) ×2
GLOVE SURG SS PI 6.0 STRL IVOR (GLOVE) ×6 IMPLANT
GOWN STRL REUS W/ TWL LRG LVL3 (GOWN DISPOSABLE) ×12 IMPLANT
GOWN STRL REUS W/ TWL XL LVL3 (GOWN DISPOSABLE) ×4 IMPLANT
GOWN STRL REUS W/TWL LRG LVL3 (GOWN DISPOSABLE) ×12
GOWN STRL REUS W/TWL XL LVL3 (GOWN DISPOSABLE) ×2
KIT BASIN OR (CUSTOM PROCEDURE TRAY) ×3 IMPLANT
LEGGING LITHOTOMY PAIR STRL (DRAPES) ×3 IMPLANT
NS IRRIG 1000ML POUR BTL (IV SOLUTION) ×6 IMPLANT
PACK COLON (CUSTOM PROCEDURE TRAY) ×3 IMPLANT
PAD ARMBOARD 7.5X6 YLW CONV (MISCELLANEOUS) ×6 IMPLANT
PENCIL BUTTON HOLSTER BLD 10FT (ELECTRODE) ×6 IMPLANT
POUCH LAPAROSCOPIC INSTRUMENT (MISCELLANEOUS) ×3 IMPLANT
RELOAD STAPLE TA45 3.5 REG BLU (ENDOMECHANICALS) ×6 IMPLANT
RTRCTR WOUND ALEXIS 18CM MED (MISCELLANEOUS) ×3
SCISSORS LAP 5X35 DISP (ENDOMECHANICALS) ×3 IMPLANT
SET IRRIG TUBING LAPAROSCOPIC (IRRIGATION / IRRIGATOR) ×3 IMPLANT
SHEARS HARMONIC ACE PLUS 36CM (ENDOMECHANICALS) ×3 IMPLANT
SLEEVE ENDOPATH XCEL 5M (ENDOMECHANICALS) ×9 IMPLANT
SPECIMEN JAR LARGE (MISCELLANEOUS) ×3 IMPLANT
SPONGE GAUZE 2X2 STER 10/PKG (GAUZE/BANDAGES/DRESSINGS) ×1
STAPLER ENDO ILS CVD 18 33 (STAPLE) ×3 IMPLANT
STAPLER VISISTAT 35W (STAPLE) ×3 IMPLANT
SUCTION POOLE TIP (SUCTIONS) ×3 IMPLANT
SURGILUBE 2OZ TUBE FLIPTOP (MISCELLANEOUS) ×3 IMPLANT
SUT PDS AB 1 TP1 96 (SUTURE) ×6 IMPLANT
SUT PDS AB 2-0 CT1 27 (SUTURE) ×3 IMPLANT
SUT PROLENE 2 0 CT2 30 (SUTURE) IMPLANT
SUT PROLENE 2 0 KS (SUTURE) IMPLANT
SUT SILK 2 0 SH CR/8 (SUTURE) ×3 IMPLANT
SUT SILK 2 0 TIES 10X30 (SUTURE) ×3 IMPLANT
SUT SILK 3 0 SH CR/8 (SUTURE) ×3 IMPLANT
SUT SILK 3 0 TIES 10X30 (SUTURE) ×3 IMPLANT
SUT VICRYL 0 UR6 27IN ABS (SUTURE) ×3 IMPLANT
SYR BULB IRRIGATION 50ML (SYRINGE) ×3 IMPLANT
SYS LAPSCP GELPORT 120MM (MISCELLANEOUS)
SYSTEM LAPSCP GELPORT 120MM (MISCELLANEOUS) IMPLANT
TOWEL OR 17X26 10 PK STRL BLUE (TOWEL DISPOSABLE) ×6 IMPLANT
TRAY FOLEY MTR SLVR 16FR STAT (SET/KITS/TRAYS/PACK) ×3 IMPLANT
TRAY LAPAROSCOPIC MC (CUSTOM PROCEDURE TRAY) IMPLANT
TRAY PROCTOSCOPIC FIBER OPTIC (SET/KITS/TRAYS/PACK) ×3 IMPLANT
TROCAR BLADELESS 12MM (ENDOMECHANICALS) ×3 IMPLANT
TROCAR XCEL 12X100 BLDLESS (ENDOMECHANICALS) IMPLANT
TROCAR XCEL BLUNT TIP 100MML (ENDOMECHANICALS) IMPLANT
TROCAR XCEL NON-BLD 11X100MML (ENDOMECHANICALS) IMPLANT
TROCAR XCEL NON-BLD 5MMX100MML (ENDOMECHANICALS) ×3 IMPLANT
TUBE CONNECTING 12X1/4 (SUCTIONS) ×6 IMPLANT
TUBING INSUF HEATED (TUBING) ×3 IMPLANT
TUBING INSUFFLATION (TUBING) ×3 IMPLANT
WATER STERILE IRR 1000ML POUR (IV SOLUTION) ×3 IMPLANT
YANKAUER SUCT BULB TIP NO VENT (SUCTIONS) ×9 IMPLANT

## 2018-07-26 NOTE — Op Note (Signed)
07/26/2018  4:03 PM  PATIENT:  Stephanie [name]  67 y.o. female  PRE-OPERATIVE DIAGNOSIS:  Diverticulitis  POST-OPERATIVE DIAGNOSIS:  Diverticulitis  PROCEDURE:  Procedure(s): LAPAROSCOPIC SIGMOID COLON RESECTION ERAS PATHWAY (N/A) Splenic flexure mobilization SURGEON:  Surgeon(s) and Role:    Axel Filler, MD - Primary    * White, Stephanie Coup, MD - Assisting  ANESTHESIA:   local and general  EBL:  100 mL   BLOOD ADMINISTERED:none  DRAINS: none   LOCAL MEDICATIONS USED:  BUPIVICAINE   SPECIMEN:  Source of Specimen:  Sigmoid colon  DISPOSITION OF SPECIMEN:  PATHOLOGY  COUNTS:  YES  TOURNIQUET:  * No tourniquets in log *  DICTATION: .Dragon Dictation Findings: Patient had a large inflammatory sigmoid colon there is an area of the left pelvic wall.  Patient had a 33 EEA stapler with intact donuts x2.  The bubble test was negative.  Details of procedure: After the patient was consented she was taken back to the OR and placed in supine position with bilateral SCDs in place.  She underwent general endotracheal intubation.  Patient was then placed in the lithotomy position with SCDs in place.  Patient was then prepped and draped in standard fashion.  Time was called all facts verified.  A Veress needle technique was used to insufflate the abdomen to 15 mmHg in the right lower quadrant.  Subsequent to this a 5 mm trocar and camera were then placed intra-abdominally.  There is no injury to any intra-abdominal organs.  A more inferior right lower quadrant as well as left lower quadrant 5 mm trocar were then placed as was a supraumbilical 5 mm trocar.  At this time the patient was positioned in the Trendelenburg position.  The small bowel was then removed from the pelvis.  The sigmoid colon could be seen adherent to the left pelvic sidewall as well as the bladder area.  At this time the colon was retracted anteriorly.  The mesentery of the sigmoid colon was then incised.  The  presacral fascia was then dissected distally.  The left ureter was identified and protected all portions of the case at this time.   At this time I sharply dissected away the sigmoid colon from the left lateral pelvic wall.  At this time the distal portion of the sigmoid colon and the adhesions to the bladder were taken only and dissected sharply from the peritoneum.  There appeared to be a fused plane from previous incision site.  At this time the distal sigmoid colon was then circumferentially dissected away from both the bladder anteriorly and the left and right pelvic sidewalls.  The area was chosen of the proximal rectum for transection.  This appeared to be just distal to the sacral prominence.  At this time I proceeded to incise the white line of Toldt proximally to help mobilize the left colon medially.  We worked our way up towards the splenic flexure.  At this time a another 5 mm working trocar was then placed in the epigastrium under direct visualization.  At this time the omentum was then taken off the transverse colon and the dissection continued distally.  We proceeded take down the splenic flexure attachments of the colon.  This allowed the colon to be medialized easily.    At this time the sigmoid mesentery and the IMA were then ligated using the harmonic scalpel.  An Endoloop was then placed at the base of the IMA.  The mesentery was then taken down  to the proximal sigmoid colon where there appeared to be normal healthy colon tissue.  At this time the right lower quadrant 5 mm trocar was upsized to a 12 mm trocar.  A endostapler was then placed in the proximal portion of the rectum was stapled using blue load x2.  At this time a lower midline incision was made using a #10 blade.  Dissection was taken down to the midline fascia.  This was elevated.  The peritoneum was then entered sharply.  At this time the fascia was then incised in line with the skin incision.  An Alexis retractor was then  placed into the wound.  The staple line of the colon was then brought out through the wound protector.  At this time the area of the proximal portion of the resection site was circumferentially dissected away from the surrounding fatty tissue.  This was then transected using cautery.  There appeared to be good healthy blood supply.  At this time sizers were used and a 33 EEA stapler was then chosen.  The anvil was then placed into the distal portion of the left colon.  This was held in place using a pursestring 0 Prolene.  At this time this was placed back into the abdomen.  From below Dr. Cliffton AstersWhite was able to place sizers up to a 33 sizer.  At this time the left colon was seen to be untwisted and the anvil was brought down to the pelvis.  The stapler was advanced from below the spike spike brought up anterior to the staple line.  At this time the anvil and the stapler were connected.  This was brought down all the way with no spaces.  The anastomosis was created with firing.  At this time this was removed and the donuts were checked.  There was 2 complete donuts.  At this time a rigid sigmoidscope placed into the rectum.  The proximal portion of the colon was unobstructed manually.  Insufflation was began the anastomosis at approximately 17 cm.  The bubble test was negative.  At this time the saline was evacuated from the pelvis.  Colon protocol was followed at this time.  At this time the fascia was then reapproximated using a #1 PDS single-stranded x2.  The skin was then reapproximated using staples.  The trocar sites were then stapled as was the lower midline incision.  Honeycomb dressing placed in the lower midline incision.  2 x 2's and tape were then placed on the trocar sites.  Patient tolerated procedure well was taken to the recovery room in stable condition.   PLAN OF CARE: Admit to inpatient   PATIENT DISPOSITION:  PACU - hemodynamically stable.   Delay start of Pharmacological VTE agent  (>24hrs) due to surgical blood loss or risk of bleeding: yes

## 2018-07-26 NOTE — H&P (Signed)
History of Present Illness  The patient is a 67 year old female who presents with diverticulitis. Patient is a 67 year old female with a history of hypertension, diabetes, hypercholesterolemia, remote history of DVT, who comes in with recurrent bouts of diverticulitis. Patient states over the last year she's had 4 bouts of diverticulitis or treated as an outpatient. Patient states that the pain usually was a left lower quadrant. She states this was associated with constipation. Patient has had multiple CT scans reveal lower sigmoid diverticulitis. Most recent CT scan was in February 2019. I did review this personally.  Patient did have a recent colonoscopy 2 years ago by Dr. Bosie ClosSchooler and had 2 polyps that were benign.  Patient's had a previous laparoscopic cholecystectomy, open hysterectomy via Pfannenstiel incision   Past Surgical History Colon Polyp Removal - Colonoscopy  Gallbladder Surgery - Laparoscopic  Hysterectomy (not due to cancer) - Complete   Diagnostic Studies History Colonoscopy  1-5 years ago Mammogram  within last year  Allergies  No Known Drug Allergies [05/24/2018]: Allergies Reconciled   Medication History  Lisinopril (40MG  Tablet, Oral) Active. hydroCHLOROthiazide (12.5MG  Tablet, Oral) Active. Atenolol (25MG  Tablet, Oral) Active. Zoloft (100MG  Tablet, Oral) Active. Aciphex (20MG  Tablet DR, Oral) Active. Aspirin (81MG  Tablet Chewable, Oral) Active. Aspirin (81MG  Tablet DR, Oral) Active. Potassium (99MG  Tablet, Oral) Active. Vitamin D3 (400UNIT Tablet, Oral) Active. Vitamin D3 (1000UNIT Capsule, Oral) Active. Magnesium (250MG  Tablet, Oral) Active. Fiber Adult Gummies (2GM Tablet Chewable, Oral) Active.  Social History  Caffeine use  Coffee. No alcohol use  No drug use  Tobacco use  Former smoker.  Family History  Arthritis  Mother. Diabetes Mellitus  Father, Sister. Heart Disease  Mother, Sister. Hypertension   Daughter, Mother, Sister. Kidney Disease  Daughter, Mother, Sister. Migraine Headache  Sister.  Pregnancy / Birth History  Age at menarche  13 years. Age of menopause  <45 Gravida  2 Maternal age  65-20 Para  2  Other Problems  Asthma  Cholelithiasis  Depression  Diabetes Mellitus  Diverticulosis  Gastroesophageal Reflux Disease  High blood pressure  Hypercholesterolemia  Oophorectomy  Bilateral. Pulmonary Embolism / Blood Clot in Legs     Review of Systems  General Present- Fatigue. Not Present- Appetite Loss, Chills, Fever, Night Sweats, Weight Gain and Weight Loss. Skin Not Present- Change in Wart/Mole, Dryness, Hives, Jaundice, New Lesions, Non-Healing Wounds, Rash and Ulcer. HEENT Not Present- Earache, Hearing Loss, Hoarseness, Nose Bleed, Oral Ulcers, Ringing in the Ears, Seasonal Allergies, Sinus Pain, Sore Throat, Visual Disturbances, Wears glasses/contact lenses and Yellow Eyes. Respiratory Not Present- Bloody sputum, Chronic Cough, Difficulty Breathing, Snoring and Wheezing. Breast Not Present- Breast Mass, Breast Pain, Nipple Discharge and Skin Changes. Cardiovascular Not Present- Chest Pain, Difficulty Breathing Lying Down, Leg Cramps, Palpitations, Rapid Heart Rate, Shortness of Breath and Swelling of Extremities. Gastrointestinal Present- Abdominal Pain, Bloating and Constipation. Not Present- Bloody Stool, Change in Bowel Habits, Chronic diarrhea, Difficulty Swallowing, Excessive gas, Gets full quickly at meals, Hemorrhoids, Indigestion, Nausea, Rectal Pain and Vomiting. Female Genitourinary Not Present- Frequency, Nocturia, Painful Urination, Pelvic Pain and Urgency. Musculoskeletal Not Present- Back Pain, Joint Pain, Joint Stiffness, Muscle Pain, Muscle Weakness and Swelling of Extremities. Neurological Not Present- Decreased Memory, Fainting, Headaches, Numbness, Seizures, Tingling, Tremor, Trouble walking and Weakness. Psychiatric Not  Present- Anxiety, Bipolar, Change in Sleep Pattern, Depression, Fearful and Frequent crying. Endocrine Not Present- Cold Intolerance, Excessive Hunger, Hair Changes, Heat Intolerance, Hot flashes and New Diabetes. Hematology Not Present- Blood Thinners, Easy Bruising, Excessive bleeding,  Gland problems, HIV and Persistent Infections.  BP (!) 113/52   Pulse (!) 55   Temp (!) 97.2 F (36.2 C) (Oral)   Resp 20   Ht 4' 10.75" (1.492 m)   Wt 79.4 kg   SpO2 98%   BMI 35.65 kg/m     Physical Exam The physical exam findings are as follows: Note:Constitutional: No acute distress, conversant, appears stated age  Eyes: Anicteric sclerae, moist conjunctiva, no lid lag  Neck: No thyromegaly, trachea midline, no cervical lymphadenopathy  Lungs: Clear to auscultation biilaterally, normal respiratory effot  Cardiovascular: regular rate & rhythm, no murmurs, no peripheal edema, pedal pulses 2+  GI: Soft, no masses or hepatosplenomegaly, non-tender to palpation, laparoscopic cholecystectomy incisions, lower Pfannenstiel incision  MSK: Normal gait, no clubbing cyanosis, edema  Skin: No rashes, palpation reveals normal skin turgor  Psychiatric: Appropriate judgment and insight, oriented to person, place, and time    Assessment & Plan  DIVERTICULITIS (U98.11) Impression: Patient is a 67 year old female with a history of hypertension, diabetes, hypercholesterolemia, remote history of DVT and recurrent bouts of diverticulitis.  1. The patient elected to proceed to the operating room for laparoscopic sigmoid colon resection. 2. We will have her enrolled in the ERAS class. 3. Did discuss with her the risks and benefits of the procedure to include but are limited to: Infection, bleeding, damage to structures, possible recurrence, possible need for ostomy. The patient was understanding and wishes to proceed.

## 2018-07-26 NOTE — Anesthesia Postprocedure Evaluation (Signed)
Anesthesia Post Note  Patient: Stephanie [name]  Procedure(s) Performed: LAPAROSCOPIC SIGMOID COLON RESECTION ERAS PATHWAY (N/A Abdomen) SIGMOIDOSCOPY (N/A Rectum)     Patient location during evaluation: PACU Anesthesia Type: General Level of consciousness: awake and alert Pain management: pain level controlled Vital Signs Assessment: post-procedure vital signs reviewed and stable Respiratory status: spontaneous breathing, nonlabored ventilation and respiratory function stable Cardiovascular status: blood pressure returned to baseline and stable Postop Assessment: no apparent nausea or vomiting Anesthetic complications: no    Last Vitals:  Vitals:   07/26/18 1715 07/26/18 1730  BP: 116/60 127/61  Pulse: (!) 48 (!) 51  Resp: 12 14  Temp:    SpO2: 96% 99%    Last Pain:  Vitals:   07/26/18 1730  TempSrc:   PainSc: 0-No pain                 Zuhair Lariccia,W. EDMOND

## 2018-07-26 NOTE — Progress Notes (Signed)
Received patient from PACU. Patient alert and oriented. Dressings c/d/i. Patient oriented to room and call bell. Family at bedside. Will continue to monitor.

## 2018-07-26 NOTE — Anesthesia Preprocedure Evaluation (Signed)
Anesthesia Evaluation  Patient identified by MRN, date of birth, ID band Patient awake    Reviewed: Allergy & Precautions, H&P , NPO status , Patient's Chart, lab work & pertinent test results, reviewed documented beta blocker date and time   Airway Mallampati: III  TM Distance: >3 FB Neck ROM: Full    Dental no notable dental hx. (+) Teeth Intact, Dental Advisory Given   Pulmonary asthma , former smoker,    Pulmonary exam normal breath sounds clear to auscultation       Cardiovascular hypertension, Pt. on medications and Pt. on home beta blockers  Rhythm:Regular Rate:Normal     Neuro/Psych  Headaches, Anxiety Depression    GI/Hepatic Neg liver ROS, GERD  Medicated and Controlled,  Endo/Other  negative endocrine ROS  Renal/GU negative Renal ROS  negative genitourinary   Musculoskeletal   Abdominal   Peds  Hematology negative hematology ROS (+)   Anesthesia Other Findings   Reproductive/Obstetrics negative OB ROS                             Anesthesia Physical Anesthesia Plan  ASA: II  Anesthesia Plan: General   Post-op Pain Management:    Induction: Intravenous  PONV Risk Score and Plan: 4 or greater and Ondansetron, Dexamethasone and Midazolam  Airway Management Planned: Oral ETT  Additional Equipment:   Intra-op Plan:   Post-operative Plan: Extubation in OR  Informed Consent: I have reviewed the patients History and Physical, chart, labs and discussed the procedure including the risks, benefits and alternatives for the proposed anesthesia with the patient or authorized representative who has indicated his/her understanding and acceptance.   Dental advisory given  Plan Discussed with: CRNA  Anesthesia Plan Comments:         Anesthesia Quick Evaluation

## 2018-07-26 NOTE — Transfer of Care (Signed)
Immediate Anesthesia Transfer of Care Note  Patient: Stephanie [name]  Procedure(s) Performed: LAPAROSCOPIC SIGMOID COLON RESECTION ERAS PATHWAY (N/A Abdomen) SIGMOIDOSCOPY (N/A Rectum)  Patient Location: PACU  Anesthesia Type:General  Level of Consciousness: awake, alert  and oriented  Airway & Oxygen Therapy: Patient Spontanous Breathing and Patient connected to face mask oxygen  Post-op Assessment: Report given to RN and Post -op Vital signs reviewed and stable  Post vital signs: Reviewed and stable  Last Vitals:  Vitals Value Taken Time  BP 113/61 07/26/2018  4:32 PM  Temp    Pulse 49 07/26/2018  4:32 PM  Resp 11 07/26/2018  4:32 PM  SpO2 100 % 07/26/2018  4:32 PM  Vitals shown include unvalidated device data.  Last Pain:  Vitals:   07/26/18 1150  TempSrc:   PainSc: 0-No pain      Patients Stated Pain Goal: 0 (07/26/18 1150)  Complications: No apparent anesthesia complications

## 2018-07-26 NOTE — Anesthesia Procedure Notes (Signed)
Procedure Name: Intubation Date/Time: 07/26/2018 1:25 PM Performed by: Mayer CamelBarr, [place] Brasington S, CRNA Pre-anesthesia Checklist: Patient identified, Emergency Drugs available, Suction available and Patient being monitored Patient Re-evaluated:Patient Re-evaluated prior to induction Oxygen Delivery Method: Circle System Utilized Preoxygenation: Pre-oxygenation with 100% oxygen Induction Type: IV induction Ventilation: Mask ventilation without difficulty Laryngoscope Size: Miller and 1 Grade View: Grade I Tube type: Oral Tube size: 7.0 mm Number of attempts: 1 Airway Equipment and Method: Stylet and Oral airway Placement Confirmation: ETT inserted through vocal cords under direct vision,  positive ETCO2 and breath sounds checked- equal and bilateral Secured at: 22 cm Tube secured with: Tape Dental Injury: Teeth and Oropharynx as per pre-operative assessment

## 2018-07-27 ENCOUNTER — Encounter (HOSPITAL_COMMUNITY): Payer: Self-pay | Admitting: General Surgery

## 2018-07-27 LAB — CBC
HCT: 34.2 % — ABNORMAL LOW (ref 36.0–46.0)
Hemoglobin: 10.7 g/dL — ABNORMAL LOW (ref 12.0–15.0)
MCH: 28.4 pg (ref 26.0–34.0)
MCHC: 31.3 g/dL (ref 30.0–36.0)
MCV: 90.7 fL (ref 80.0–100.0)
PLATELETS: 274 10*3/uL (ref 150–400)
RBC: 3.77 MIL/uL — ABNORMAL LOW (ref 3.87–5.11)
RDW: 13.3 % (ref 11.5–15.5)
WBC: 12.5 10*3/uL — ABNORMAL HIGH (ref 4.0–10.5)
nRBC: 0 % (ref 0.0–0.2)

## 2018-07-27 LAB — BASIC METABOLIC PANEL
Anion gap: 13 (ref 5–15)
BUN: 13 mg/dL (ref 8–23)
CALCIUM: 8.4 mg/dL — AB (ref 8.9–10.3)
CO2: 22 mmol/L (ref 22–32)
CREATININE: 1.04 mg/dL — AB (ref 0.44–1.00)
Chloride: 104 mmol/L (ref 98–111)
GFR calc Af Amer: >60 mL/min (ref >60)
GFR calc non Af Amer: 56 mL/min — ABNORMAL LOW (ref >60)
Glucose, Bld: 149 mg/dL — ABNORMAL HIGH (ref 70–99)
Potassium: 3.9 mmol/L (ref 3.5–5.1)
Sodium: 139 mmol/L (ref 135–145)

## 2018-07-27 MED ORDER — SODIUM CHLORIDE 0.9 % IV BOLUS
500.0000 mL | Freq: Once | INTRAVENOUS | Status: AC
  Administered 2018-07-27: 500 mL via INTRAVENOUS

## 2018-07-27 MED ORDER — SODIUM CHLORIDE 0.9 % IV BOLUS
1000.0000 mL | Freq: Once | INTRAVENOUS | Status: AC
  Administered 2018-07-27: 1000 mL via INTRAVENOUS

## 2018-07-27 MED ORDER — TRAMADOL HCL 50 MG PO TABS
50.0000 mg | ORAL_TABLET | Freq: Four times a day (QID) | ORAL | Status: DC | PRN
  Administered 2018-07-27 – 2018-07-28 (×6): 50 mg via ORAL
  Filled 2018-07-27 (×6): qty 1

## 2018-07-27 NOTE — Progress Notes (Signed)
1 Day Post-Op   Subjective/Chief Complaint: Patient with lower incisional abdominal pain, being treated with Toradol Patient with low urine output overnight.  Patient bolused 1.5 L.  Foley flushed and appeared to be working well.   Objective: Vital signs in last 24 hours: Temp:  [97.2 F (36.2 C)-98.8 F (37.1 C)] 98.8 F (37.1 C) (12/10 0539) Pulse Rate:  [48-68] 68 (12/10 0539) Resp:  [12-20] 17 (12/09 2220) BP: (89-127)/(48-65) 89/49 (12/10 0539) SpO2:  [93 %-100 %] 95 % (12/10 0539) Weight:  [79.4 kg] 79.4 kg (12/09 1133) Last BM Date: 07/26/18  Intake/Output from previous day: 12/09 0701 - 12/10 0700 In: 2722.8 [P.O.:280; I.V.:2442.8] Out: 360 [Urine:260; Blood:100] Intake/Output this shift: Total I/O In: 607.8 [P.O.:240; I.V.:367.8] Out: 85 [Urine:85]  General appearance: alert and cooperative GI: soft, non-tender; bowel sounds normal; no masses,  no organomegaly and Incision clean dry and intact.  Lab Results:  Recent Labs    07/27/18 0153  WBC 12.5*  HGB 10.7*  HCT 34.2*  PLT 274   BMET Recent Labs    07/27/18 0153  NA 139  K 3.9  CL 104  CO2 22  GLUCOSE 149*  BUN 13  CREATININE 1.04*  CALCIUM 8.4*   PT/INR No results for input(s): LABPROT, INR in the last 72 hours. ABG No results for input(s): PHART, HCO3 in the last 72 hours.  Invalid input(s): PCO2, PO2  Studies/Results: No results found.  Anti-infectives: Anti-infectives (From admission, onward)   Start     Dose/Rate Route Frequency Ordered Stop   07/26/18 1156  cefoTEtan in Dextrose 5% (CEFOTAN) 2-2.08 GM-%(50ML) IVPB    Note to Pharmacy:  Stephanie [name], Leigh   : cabinet override      07/26/18 1156 07/26/18 1335   07/26/18 1130  cefoTEtan in Dextrose 5% (CEFOTAN) IVPB 2 g     2 g 100 mL/hr over 30 Minutes Intravenous On call to O.R. 07/26/18 1127 07/26/18 1335      Assessment/Plan: s/p Procedure(s): LAPAROSCOPIC SIGMOID COLON RESECTION ERAS PATHWAY (N/A) SIGMOIDOSCOPY (N/A)   1.  We will continue Foley today to monitor urine output 2.  Ambulate per a rest protocol 3.  Continue to advance diet to full liquids as tolerated 4.  Continue with pain control   LOS: 1 day    Stephanie [name] 07/27/2018

## 2018-07-27 NOTE — Progress Notes (Signed)
0030:  Pt's urine output is only 50cc thru foley. Bladder scan showing 0ml. Dr. Derrell Lollingamirez made aware.   [id number]: 500cc bolus and increased IVF from 75ml to 125ml.  0530: Pt only made 35ml of urine since midnight. Bp=89/49. Called Dr. Derrell Lollingamirez again.   0540: 1000ml bolus has been ordered. And keep the foley in for now.

## 2018-07-28 LAB — BASIC METABOLIC PANEL
ANION GAP: 4 — AB (ref 5–15)
BUN: 8 mg/dL (ref 8–23)
CO2: 27 mmol/L (ref 22–32)
Calcium: 8.4 mg/dL — ABNORMAL LOW (ref 8.9–10.3)
Chloride: 109 mmol/L (ref 98–111)
Creatinine, Ser: 0.81 mg/dL (ref 0.44–1.00)
GFR calc Af Amer: >60 mL/min (ref >60)
GFR calc non Af Amer: >60 mL/min (ref >60)
Glucose, Bld: 106 mg/dL — ABNORMAL HIGH (ref 70–99)
Potassium: 3.4 mmol/L — ABNORMAL LOW (ref 3.5–5.1)
SODIUM: 140 mmol/L (ref 135–145)

## 2018-07-28 LAB — CBC
HCT: 28.9 % — ABNORMAL LOW (ref 36.0–46.0)
Hemoglobin: 9.4 g/dL — ABNORMAL LOW (ref 12.0–15.0)
MCH: 29.4 pg (ref 26.0–34.0)
MCHC: 32.5 g/dL (ref 30.0–36.0)
MCV: 90.3 fL (ref 80.0–100.0)
Platelets: 195 10*3/uL (ref 150–400)
RBC: 3.2 MIL/uL — ABNORMAL LOW (ref 3.87–5.11)
RDW: 13.6 % (ref 11.5–15.5)
WBC: 7 10*3/uL (ref 4.0–10.5)
nRBC: 0 % (ref 0.0–0.2)

## 2018-07-28 MED ORDER — SERTRALINE HCL 100 MG PO TABS
100.0000 mg | ORAL_TABLET | Freq: Every day | ORAL | Status: DC
  Administered 2018-07-28: 100 mg via ORAL
  Filled 2018-07-28: qty 1

## 2018-07-28 MED ORDER — TRAMADOL HCL 50 MG PO TABS: 50.0000 mg | ORAL_TABLET | Freq: Four times a day (QID) | ORAL | 0 refills | Status: DC | PRN

## 2018-07-28 NOTE — Discharge Instructions (Signed)

## 2018-07-28 NOTE — Care Management Note (Signed)
Case Management Note  Patient Details  Name: Stephanie [name] MRN: [id number] Date of Birth: 04/08/1951  Subjective/Objective:                    Action/Plan:  Received a call from Earnest Rosieriffany Adams with [facility]. DR Derrell Lollingamirez office has made a referral [facility]. [facility] already has orders for home health. After patient discharged someone from [facility] will call patient at home and check on her, if she is agreeable a home health RN will make a home visit.  Patient aware of above, voiced understanding, and agreeable.  Expected Discharge Date:                  Expected Discharge Plan:  Home w Home Health Services  In-House Referral:  NA  Discharge planning Services  CM Consult  Post Acute Care Choice:  Home Health Choice offered to:  Patient  DME Arranged:  N/A DME Agency:  NA  HH Arranged:  RN, PT HH Agency:  [facility]  Status of Service:  Completed, signed off  If discussed at Long Length of Stay Meetings, dates discussed:    Additional Comments:  Kingsley PlanWile, Yoshio Seliga Marie, RN 07/28/2018, 11:52 AM

## 2018-07-28 NOTE — Progress Notes (Signed)
2 Days Post-Op   Subjective/Chief Complaint: Doing well. UOP picked up Ambulating on her own. Still some abdominal pain  Objective: Vital signs in last 24 hours: Temp:  [98.1 F (36.7 C)-100.1 F (37.8 C)] 98.9 F (37.2 C) (12/11 0513) Pulse Rate:  [67-75] 67 (12/11 0513) Resp:  [16-18] 18 (12/11 0513) BP: (101-113)/(56-58) 101/58 (12/11 0513) SpO2:  [95 %-97 %] 96 % (12/11 0513) Last BM Date: 07/27/18  Intake/Output from previous day: 12/10 0701 - 12/11 0700 In: 450 [P.O.:450] Out: 1700 [Urine:1700] Intake/Output this shift: No intake/output data recorded.  General appearance: alert and cooperative GI: soft, non-tender; bowel sounds normal; no masses,  no organomegaly and inc c/d/i  Lab Results:  Recent Labs    07/27/18 0153 07/28/18 0152  WBC 12.5* 7.0  HGB 10.7* 9.4*  HCT 34.2* 28.9*  PLT 274 195   BMET Recent Labs    07/27/18 0153 07/28/18 0152  NA 139 140  K 3.9 3.4*  CL 104 109  CO2 22 27  GLUCOSE 149* 106*  BUN 13 8  CREATININE 1.04* 0.81  CALCIUM 8.4* 8.4*   PT/INR No results for input(s): LABPROT, INR in the last 72 hours. ABG No results for input(s): PHART, HCO3 in the last 72 hours.  Invalid input(s): PCO2, PO2  Studies/Results: No results found.  Anti-infectives: Anti-infectives (From admission, onward)   Start     Dose/Rate Route Frequency Ordered Stop   07/26/18 1156  cefoTEtan in Dextrose 5% (CEFOTAN) 2-2.08 GM-%(50ML) IVPB    Note to Pharmacy:  Sandi RavelingSchonewitz, Leigh   : cabinet override      07/26/18 1156 07/26/18 1335   07/26/18 1130  cefoTEtan in Dextrose 5% (CEFOTAN) IVPB 2 g     2 g 100 mL/hr over 30 Minutes Intravenous On call to O.R. 07/26/18 1127 07/26/18 1335      Assessment/Plan: s/p Procedure(s): LAPAROSCOPIC SIGMOID COLON RESECTION ERAS PATHWAY (N/A) SIGMOIDOSCOPY (N/A) Mobilize pulm toilet Plan home later today or tomorrow.    LOS: 2 days    Axel Fillerrmando Patryk Conant 07/28/2018

## 2018-07-29 NOTE — Discharge Summary (Signed)
Physician Discharge Summary  Patient ID: Stephanie [name] MRN: [id number] DOB/AGE: 11/12/1950 67 y.o.  Admit date: 07/26/2018 Discharge date: 07/29/2018  Admission Diagnoses: diverticulitis  Discharge Diagnoses:  Active Problems:   S/P partial resection of colon   Discharged Condition: good  Hospital Course: Pt was admitted post op.  Please see op note for full details.  Post op pt was on ERAS Protocol.  She had good pain control.  She had BM on POD 1.  She was adv from a clear diet to a reg diet with no issues.  She was ambulating well on her own with no complaints.  She was afebrile, deemed stable for DC and DC'd home.   Consults: None  Significant Diagnostic Studies: none   Treatments: surgery: as above  Discharge Exam: Blood pressure 128/63, pulse (!) 58, temperature 98.4 F (36.9 C), temperature source Oral, resp. rate 18, height 4' 10.75" (1.492 m), weight 83.6 kg, SpO2 97 %. General appearance: alert and cooperative GI: soft, non-tender; bowel sounds normal; no masses,  no organomegaly and inc c/d/i  Disposition: Discharge disposition: 01-Home or Self Care       Discharge Instructions    Diet - low sodium heart healthy   Complete by:  As directed    Increase activity slowly   Complete by:  As directed      Allergies as of 07/29/2018      Reactions   Shellfish Allergy Anaphylaxis, Hives   Albuterol    Jittery    Flagyl [metronidazole] Nausea Only      Medication List    TAKE these medications   albuterol 108 (90 Base) MCG/ACT inhaler Commonly known as:  PROVENTIL HFA;VENTOLIN HFA Inhale 2 puffs into the lungs every 6 (six) hours as needed for wheezing or shortness of breath.   amoxicillin-clavulanate 875-125 MG tablet Commonly known as:  AUGMENTIN Take 1 tablet by mouth every 12 (twelve) hours.   aspirin EC 81 MG tablet Take 81 mg by mouth daily.   atenolol 25 MG tablet Commonly known as:  TENORMIN Take 25 mg by mouth daily.    hydrochlorothiazide 12.5 MG capsule Commonly known as:  MICROZIDE Take 12.5 mg by mouth daily.   lisinopril 40 MG tablet Commonly known as:  PRINIVIL,ZESTRIL Take 40 mg by mouth daily.   ondansetron 4 MG disintegrating tablet Commonly known as:  ZOFRAN ODT Take 1 tablet (4 mg total) by mouth every 8 (eight) hours as needed for nausea or vomiting.   ondansetron 8 MG tablet Commonly known as:  ZOFRAN Take 1 tablet (8 mg total) by mouth every 4 (four) hours as needed for nausea.   pantoprazole 20 MG tablet Commonly known as:  PROTONIX Take 20 mg by mouth daily.   RABEprazole 20 MG tablet Commonly known as:  ACIPHEX Take 20 mg by mouth daily.   sertraline 100 MG tablet Commonly known as:  ZOLOFT Take 100 mg by mouth daily.   traMADol 50 MG tablet Commonly known as:  ULTRAM Take 1 tablet (50 mg total) by mouth every 6 (six) hours as needed for moderate pain.   Vitamin D 50 MCG (2000 UT) Caps Take 2,000 Units by mouth daily.   zolpidem 10 MG tablet Commonly known as:  AMBIEN Take 10 mg by mouth at bedtime.      Follow-up Information    Axel Filler, MD. Schedule an appointment as soon as possible for a visit in 1 week(s).   Specialty:  General Surgery Why:  staple removal  Contact information: [address] [place] [address] [phone]           Signed: Axel Fillerrmando Bridgit Eynon 07/29/2018, 8:08 AM

## 2018-07-29 NOTE — Progress Notes (Signed)
Reviewed AVS discharge instructions with patient/caregiver. Patient/caregiver verbalizes understanding of instructions received. AVS and prescriptions received by patient/caregiver. If present, telemetry box removed and central cardiac monitoring department notified of discharge. Peripheral IV removed, site benign with tip intact. Awaiting volunteer services to transport to car.

## 2018-08-19 DIAGNOSIS — S9032XA Contusion of left foot, initial encounter: Secondary | ICD-10-CM | POA: Diagnosis not present

## 2018-08-19 DIAGNOSIS — M79672 Pain in left foot: Secondary | ICD-10-CM | POA: Diagnosis not present

## 2018-08-27 DIAGNOSIS — T782XXA Anaphylactic shock, unspecified, initial encounter: Secondary | ICD-10-CM | POA: Diagnosis not present

## 2018-08-27 DIAGNOSIS — R0602 Shortness of breath: Secondary | ICD-10-CM | POA: Diagnosis not present

## 2018-08-27 DIAGNOSIS — T7840XA Allergy, unspecified, initial encounter: Secondary | ICD-10-CM | POA: Diagnosis not present

## 2018-08-27 DIAGNOSIS — Z91013 Allergy to seafood: Secondary | ICD-10-CM | POA: Diagnosis not present

## 2018-08-27 DIAGNOSIS — I10 Essential (primary) hypertension: Secondary | ICD-10-CM | POA: Diagnosis not present

## 2018-08-27 DIAGNOSIS — L500 Allergic urticaria: Secondary | ICD-10-CM | POA: Diagnosis not present

## 2018-08-27 DIAGNOSIS — R0689 Other abnormalities of breathing: Secondary | ICD-10-CM | POA: Diagnosis not present

## 2018-08-27 DIAGNOSIS — L509 Urticaria, unspecified: Secondary | ICD-10-CM | POA: Diagnosis not present

## 2018-08-27 DIAGNOSIS — Z87891 Personal history of nicotine dependence: Secondary | ICD-10-CM | POA: Diagnosis not present

## 2018-08-27 DIAGNOSIS — R220 Localized swelling, mass and lump, head: Secondary | ICD-10-CM | POA: Diagnosis not present

## 2018-09-04 DIAGNOSIS — T7840XA Allergy, unspecified, initial encounter: Secondary | ICD-10-CM | POA: Diagnosis not present

## 2018-09-23 ENCOUNTER — Encounter (HOSPITAL_BASED_OUTPATIENT_CLINIC_OR_DEPARTMENT_OTHER): Payer: Self-pay | Admitting: *Deleted

## 2018-09-23 ENCOUNTER — Emergency Department (HOSPITAL_BASED_OUTPATIENT_CLINIC_OR_DEPARTMENT_OTHER)
Admission: EM | Admit: 2018-09-23 | Discharge: 2018-09-23 | Disposition: A | Discharge status: Home / Self Care | Payer: BLUE CROSS/BLUE SHIELD | Attending: Emergency Medicine | Admitting: Emergency Medicine

## 2018-09-23 ENCOUNTER — Other Ambulatory Visit: Payer: Self-pay

## 2018-09-23 DIAGNOSIS — R220 Localized swelling, mass and lump, head: Secondary | ICD-10-CM | POA: Diagnosis present

## 2018-09-23 DIAGNOSIS — F419 Anxiety disorder, unspecified: Secondary | ICD-10-CM | POA: Insufficient documentation

## 2018-09-23 DIAGNOSIS — E119 Type 2 diabetes mellitus without complications: Secondary | ICD-10-CM | POA: Insufficient documentation

## 2018-09-23 DIAGNOSIS — I10 Essential (primary) hypertension: Secondary | ICD-10-CM | POA: Insufficient documentation

## 2018-09-23 DIAGNOSIS — F329 Major depressive disorder, single episode, unspecified: Secondary | ICD-10-CM | POA: Diagnosis not present

## 2018-09-23 DIAGNOSIS — Z79899 Other long term (current) drug therapy: Secondary | ICD-10-CM | POA: Insufficient documentation

## 2018-09-23 DIAGNOSIS — Z9049 Acquired absence of other specified parts of digestive tract: Secondary | ICD-10-CM | POA: Insufficient documentation

## 2018-09-23 DIAGNOSIS — T7840XA Allergy, unspecified, initial encounter: Secondary | ICD-10-CM | POA: Diagnosis not present

## 2018-09-23 DIAGNOSIS — Z87891 Personal history of nicotine dependence: Secondary | ICD-10-CM | POA: Insufficient documentation

## 2018-09-23 DIAGNOSIS — Z7982 Long term (current) use of aspirin: Secondary | ICD-10-CM | POA: Insufficient documentation

## 2018-09-23 MED ORDER — FAMOTIDINE 20 MG PO TABS: 20.0000 mg | ORAL_TABLET | Freq: Two times a day (BID) | ORAL | 0 refills | Status: DC

## 2018-09-23 MED ORDER — FAMOTIDINE 20 MG PO TABS
20.0000 mg | ORAL_TABLET | Freq: Once | ORAL | Status: AC
  Administered 2018-09-23: 20 mg via ORAL
  Filled 2018-09-23: qty 1

## 2018-09-23 MED ORDER — DIPHENHYDRAMINE HCL 25 MG PO TABS: 25.0000 mg | ORAL_TABLET | Freq: Four times a day (QID) | ORAL | 0 refills | Status: DC

## 2018-09-23 MED ORDER — PREDNISONE 50 MG PO TABS
60.0000 mg | ORAL_TABLET | Freq: Once | ORAL | Status: AC
  Administered 2018-09-23: 60 mg via ORAL
  Filled 2018-09-23: qty 1

## 2018-09-23 MED ORDER — PREDNISONE 10 MG PO TABS: 40.0000 mg | ORAL_TABLET | Freq: Every day | ORAL | 0 refills | Status: DC

## 2018-09-23 NOTE — ED Notes (Signed)
ED Provider at bedside. 

## 2018-09-23 NOTE — ED Notes (Signed)
Swelling left lower lip, red area on right forearm. Lungs clr, speech clear. Airway intact.

## 2018-09-23 NOTE — Discharge Instructions (Signed)
Return for any new or worse symptoms.  Make an appointment follow-up with your doctor.  I feel that probably seeing a allergist and having skin testing would be appropriate.  Even if you do think it is related to peanut butter.  Take the Benadryl every 6 hours for at least the next 2 days.  Take Pepcid for the next 7 days and a prednisone for the next 5 days.

## 2018-09-23 NOTE — ED Provider Notes (Signed)
[facility] EMERGENCY DEPARTMENT Provider Note   CSN: [id number] Arrival date & time: 09/23/18  2037     History   Chief Complaint Chief Complaint  Patient presents with  . Angioedema    HPI Stephanie [name] is a 68 y.o. female.  Patient with recurrent breakout of hives and facial swelling and this time swelling to the right lower lip.  Started about 4 hours ago.  She has had this happen several times now over the past several weeks.  Gets better with standard treatments of Benadryl and prednisone but then it comes back exact cause is not clear.  Family does raise issue they think it may be related to peanut butter.  Patient's had trouble with shellfish in the past but has not had any recently.  She has a history of asthma and used her inhaler 3 hours ago with relief of the discomfort in her chest but does not feel that that was related to this reaction.  She feels like she is moving air fine.  She has had no tongue swelling no difficulty swallowing.  Did not feel as if she was going to pass out.     Past Medical History:  Diagnosis Date  . Anxiety   . Depression   . DVT (deep venous thrombosis) (HCC)    "LLE; same time as I had hysterectomy"  . Family history of adverse reaction to anesthesia    "my momma gets real sick" (07/27/2018)  . GERD (gastroesophageal reflux disease)   . HOH (hard of hearing)    hears better out of left.  . Hypertension   . Seasonal asthma   . Type 2 diabetes, diet controlled [facility])     Patient Active Problem List   Diagnosis Date Noted  . S/P partial resection of colon 07/26/2018  . SARCOIDOSIS 03/15/2007  . METABOLIC SYNDROME X 03/15/2007  . G E R D 03/15/2007  . SYMPTOM, POLYDIPSIA 03/15/2007  . SYMPTOM, HEADACHE 03/15/2007    Past Surgical History:  Procedure Laterality Date  . ABDOMINAL HYSTERECTOMY    . CARPAL TUNNEL RELEASE Bilateral   . COLECTOMY  07/26/2018   "for diverticulitis"  . COLON RESECTION N/A 07/26/2018   Procedure: LAPAROSCOPIC SIGMOID COLON RESECTION ERAS PATHWAY;  Surgeon: Axel Filler, MD;  Location: [facility] OR;  Service: General;  Laterality: N/A;  . COLONOSCOPY W/ BIOPSIES AND POLYPECTOMY     "benign" (07/27/2018)  . LAPAROSCOPIC CHOLECYSTECTOMY    . REFRACTIVE SURGERY Bilateral 2006  . SIGMOIDOSCOPY N/A 07/26/2018   Procedure: SIGMOIDOSCOPY;  Surgeon: Axel Filler, MD;  Location: [facility] OR;  Service: General;  Laterality: N/A;  . TUBAL LIGATION       OB History   No obstetric history on file.      Home Medications    Prior to Admission medications   Medication Sig Start Date End Date Taking? Authorizing Provider  albuterol (PROVENTIL HFA;VENTOLIN HFA) 108 (90 BASE) MCG/ACT inhaler Inhale 2 puffs into the lungs every 6 (six) hours as needed for wheezing or shortness of breath.     [provider]  amoxicillin-clavulanate (AUGMENTIN) 875-125 MG tablet Take 1 tablet by mouth every 12 (twelve) hours. Patient not taking: Reported on 07/12/2018 09/29/17   Liberty Handy, PA-C  aspirin EC 81 MG tablet Take 81 mg by mouth daily.    [provider]  atenolol (TENORMIN) 25 MG tablet Take 25 mg by mouth daily.     [provider]  Cholecalciferol (VITAMIN D) 50 MCG (  2000 UT) CAPS Take 2,000 Units by mouth daily.    [provider]  diphenhydrAMINE (BENADRYL) 25 MG tablet Take 1 tablet (25 mg total) by mouth every 6 (six) hours. 09/23/18   Vanetta MuldersZackowski, Kairos Panetta, MD  famotidine (PEPCID) 20 MG tablet Take 1 tablet (20 mg total) by mouth 2 (two) times daily. 09/23/18   Vanetta MuldersZackowski, Eric Nees, MD  hydrochlorothiazide (MICROZIDE) 12.5 MG capsule Take 12.5 mg by mouth daily.    [provider]  lisinopril (PRINIVIL,ZESTRIL) 40 MG tablet Take 40 mg by mouth daily.    [provider]  ondansetron (ZOFRAN ODT) 4 MG disintegrating tablet Take 1 tablet (4 mg total) by mouth every 8 (eight) hours as needed for nausea or vomiting. Patient not taking: Reported on  07/12/2018 09/29/17   Liberty HandyGibbons, Claudia J, PA-C  ondansetron (ZOFRAN) 8 MG tablet Take 1 tablet (8 mg total) by mouth every 4 (four) hours as needed for nausea. Patient not taking: Reported on 07/12/2018 09/23/12   Geoffery Lyonselo, Douglas, MD  pantoprazole (PROTONIX) 20 MG tablet Take 20 mg by mouth daily.    [provider]  predniSONE (DELTASONE) 10 MG tablet Take 4 tablets (40 mg total) by mouth daily. 09/23/18   Vanetta MuldersZackowski, Zahria Ding, MD  RABEprazole (ACIPHEX) 20 MG tablet Take 20 mg by mouth daily.    [provider]  sertraline (ZOLOFT) 100 MG tablet Take 100 mg by mouth daily.    [provider]  traMADol (ULTRAM) 50 MG tablet Take 1 tablet (50 mg total) by mouth every 6 (six) hours as needed for moderate pain. 07/28/18   Axel Filleramirez, Armando, MD  zolpidem (AMBIEN) 10 MG tablet Take 10 mg by mouth at bedtime. 06/14/18   [provider]    Family History No family history on file.  Social History Social History   Tobacco Use  . Smoking status: Former Smoker    Packs/day: 1.00    Types: Cigarettes    Last attempt to quit: 1984    Years since quitting: 36.1  . Smokeless tobacco: Never Used  Substance Use Topics  . Alcohol use: No  . Drug use: Never     Allergies   Shellfish allergy; Albuterol; and Flagyl [metronidazole]   Review of Systems Review of Systems  Constitutional: Negative for chills and fever.  HENT: Positive for facial swelling. Negative for congestion, rhinorrhea, sore throat and trouble swallowing.   Eyes: Negative for redness, itching and visual disturbance.  Respiratory: Positive for wheezing. Negative for cough and shortness of breath.   Cardiovascular: Negative for chest pain and leg swelling.  Gastrointestinal: Negative for abdominal pain, diarrhea, nausea and vomiting.  Genitourinary: Negative for dysuria.  Musculoskeletal: Negative for back pain and neck pain.  Skin: Positive for rash.  Neurological: Negative for dizziness, syncope,  light-headedness and headaches.  Hematological: Does not bruise/bleed easily.  Psychiatric/Behavioral: Negative for confusion.     Physical Exam Updated Vital Signs BP 127/63   Pulse 61   Temp 98.5 F (36.9 C) (Oral)   Resp 16   Ht 1.492 m (4' 10.75")   Wt 78.5 kg   SpO2 98%   BMI 35.24 kg/m   Physical Exam Vitals signs and nursing note reviewed.  Constitutional:      General: She is not in acute distress.    Appearance: She is well-developed.  HENT:     Head: Normocephalic and atraumatic.     Comments: Swelling to the right side of the face the swelling to the right side of  the lower lip.  No tongue swelling.  Oropharynx clear uvula midline.    Mouth/Throat:     Mouth: Mucous membranes are moist.     Pharynx: Oropharynx is clear.  Eyes:     Extraocular Movements: Extraocular movements intact.     Conjunctiva/sclera: Conjunctivae normal.     Pupils: Pupils are equal, round, and reactive to light.  Neck:     Musculoskeletal: Neck supple.  Cardiovascular:     Rate and Rhythm: Normal rate and regular rhythm.     Heart sounds: Normal heart sounds. No murmur.  Pulmonary:     Effort: Pulmonary effort is normal. No respiratory distress.     Breath sounds: Normal breath sounds.  Abdominal:     General: Bowel sounds are normal.     Palpations: Abdomen is soft.     Tenderness: There is no abdominal tenderness.  Musculoskeletal: Normal range of motion.  Skin:    General: Skin is warm and dry.     Capillary Refill: Capillary refill takes less than 2 seconds.     Findings: Erythema and rash present.     Comments: Hive lesions scattered to arm and trunk area.  Also hives to the right side of the face.  Neurological:     General: No focal deficit present.     Mental Status: She is alert and oriented to person, place, and time.     Cranial Nerves: No cranial nerve deficit.      ED Treatments / Results  Labs (all labs ordered are listed, but only abnormal results are  displayed) Labs Reviewed - No data to display  EKG None  Radiology No results found.  Procedures Procedures (including critical care time)  Medications Ordered in ED Medications  predniSONE (DELTASONE) tablet 60 mg (60 mg Oral Given 09/23/18 2231)  famotidine (PEPCID) tablet 20 mg (20 mg Oral Given 09/23/18 2231)     Initial Impression / Assessment and Plan / ED Course  I have reviewed the triage vital signs and the nursing notes.  Pertinent labs & imaging results that were available during my care of the patient were reviewed by me and considered in my medical decision making (see chart for details).     Patient with recurrent problems with allergic reaction with hives and lip swelling.  Now with swelling to the right side of the face is a big hives and swelling just to the right lower lip.  No tongue swelling.  No trouble breathing.  This is happened several times over the last few months.  Patient will get better on prednisone and Benadryl but then it reoccurs.  Is possible that peanut butter may be related according to the patient's daughter.  Patient in no acute distress here treated with prednisone and Pepcid.  Recommend patient follow back up with her primary care doctor may be the best to see an allergist at this time.  Patient will also take Benadryl for the next 2 days.  Patient last had Benadryl tonight at 6 PM.  No wheezing.  Final Clinical Impressions(s) / ED Diagnoses   Final diagnoses:  Allergic reaction, initial encounter    ED Discharge Orders         Ordered    predniSONE (DELTASONE) 10 MG tablet  Daily     09/23/18 2320    famotidine (PEPCID) 20 MG tablet  2 times daily     09/23/18 2320    diphenhydrAMINE (BENADRYL) 25 MG tablet  Every 6 hours  09/23/18 2320           Vanetta Mulders, MD 09/24/18 1649

## 2018-09-23 NOTE — ED Triage Notes (Signed)
Lower lip swelling x 4 hours. She also has a hive on her right forearm. She took Benadryl 50 mg 2 hours ago. She has a hx of asthma and she used her inhaler 3 hours ago with relief of tightness in her chest.

## 2018-09-29 DIAGNOSIS — T783XXA Angioneurotic edema, initial encounter: Secondary | ICD-10-CM | POA: Diagnosis not present

## 2018-09-29 DIAGNOSIS — I10 Essential (primary) hypertension: Secondary | ICD-10-CM | POA: Diagnosis not present

## 2018-10-19 DIAGNOSIS — R220 Localized swelling, mass and lump, head: Secondary | ICD-10-CM | POA: Diagnosis not present

## 2018-10-19 DIAGNOSIS — T783XXA Angioneurotic edema, initial encounter: Secondary | ICD-10-CM | POA: Diagnosis not present

## 2018-10-26 DIAGNOSIS — J3089 Other allergic rhinitis: Secondary | ICD-10-CM | POA: Diagnosis not present

## 2018-10-26 DIAGNOSIS — J301 Allergic rhinitis due to pollen: Secondary | ICD-10-CM | POA: Diagnosis not present

## 2018-10-26 DIAGNOSIS — J4520 Mild intermittent asthma, uncomplicated: Secondary | ICD-10-CM | POA: Diagnosis not present

## 2018-10-26 DIAGNOSIS — J3081 Allergic rhinitis due to animal (cat) (dog) hair and dander: Secondary | ICD-10-CM | POA: Diagnosis not present

## 2018-10-26 DIAGNOSIS — T783XXA Angioneurotic edema, initial encounter: Secondary | ICD-10-CM | POA: Diagnosis not present

## 2018-11-10 DIAGNOSIS — J45909 Unspecified asthma, uncomplicated: Secondary | ICD-10-CM | POA: Diagnosis not present

## 2018-11-10 DIAGNOSIS — E7800 Pure hypercholesterolemia, unspecified: Secondary | ICD-10-CM | POA: Diagnosis not present

## 2018-11-10 DIAGNOSIS — I10 Essential (primary) hypertension: Secondary | ICD-10-CM | POA: Diagnosis not present

## 2018-11-10 DIAGNOSIS — K219 Gastro-esophageal reflux disease without esophagitis: Secondary | ICD-10-CM | POA: Diagnosis not present

## 2018-11-10 DIAGNOSIS — E559 Vitamin D deficiency, unspecified: Secondary | ICD-10-CM | POA: Diagnosis not present

## 2018-11-10 DIAGNOSIS — E119 Type 2 diabetes mellitus without complications: Secondary | ICD-10-CM | POA: Diagnosis not present

## 2019-01-13 DIAGNOSIS — W57XXXA Bitten or stung by nonvenomous insect and other nonvenomous arthropods, initial encounter: Secondary | ICD-10-CM | POA: Diagnosis not present

## 2019-01-13 DIAGNOSIS — S80861A Insect bite (nonvenomous), right lower leg, initial encounter: Secondary | ICD-10-CM | POA: Diagnosis not present

## 2019-05-16 DIAGNOSIS — K219 Gastro-esophageal reflux disease without esophagitis: Secondary | ICD-10-CM | POA: Diagnosis not present

## 2019-05-16 DIAGNOSIS — I10 Essential (primary) hypertension: Secondary | ICD-10-CM | POA: Diagnosis not present

## 2019-05-16 DIAGNOSIS — J45909 Unspecified asthma, uncomplicated: Secondary | ICD-10-CM | POA: Diagnosis not present

## 2019-05-16 DIAGNOSIS — Z23 Encounter for immunization: Secondary | ICD-10-CM | POA: Diagnosis not present

## 2019-05-16 DIAGNOSIS — E119 Type 2 diabetes mellitus without complications: Secondary | ICD-10-CM | POA: Diagnosis not present

## 2019-05-16 DIAGNOSIS — E7800 Pure hypercholesterolemia, unspecified: Secondary | ICD-10-CM | POA: Diagnosis not present

## 2019-06-09 DIAGNOSIS — L03032 Cellulitis of left toe: Secondary | ICD-10-CM | POA: Diagnosis not present

## 2019-07-14 ENCOUNTER — Other Ambulatory Visit: Payer: Self-pay

## 2019-07-14 ENCOUNTER — Encounter (HOSPITAL_BASED_OUTPATIENT_CLINIC_OR_DEPARTMENT_OTHER): Payer: Self-pay | Admitting: Emergency Medicine

## 2019-07-14 ENCOUNTER — Emergency Department (HOSPITAL_BASED_OUTPATIENT_CLINIC_OR_DEPARTMENT_OTHER): Payer: BC Managed Care – PPO

## 2019-07-14 ENCOUNTER — Emergency Department (HOSPITAL_BASED_OUTPATIENT_CLINIC_OR_DEPARTMENT_OTHER)
Admission: EM | Admit: 2019-07-14 | Discharge: 2019-07-14 | Disposition: A | Discharge status: Home / Self Care | Payer: BC Managed Care – PPO | Attending: Emergency Medicine | Admitting: Emergency Medicine

## 2019-07-14 ENCOUNTER — Emergency Department (HOSPITAL_BASED_OUTPATIENT_CLINIC_OR_DEPARTMENT_OTHER)
Admit: 2019-07-14 | Discharge: 2019-07-14 | Disposition: A | Discharge status: Home / Self Care | Payer: BC Managed Care – PPO | Attending: Emergency Medicine | Admitting: Emergency Medicine

## 2019-07-14 DIAGNOSIS — Y9389 Activity, other specified: Secondary | ICD-10-CM | POA: Insufficient documentation

## 2019-07-14 DIAGNOSIS — Z86718 Personal history of other venous thrombosis and embolism: Secondary | ICD-10-CM | POA: Diagnosis not present

## 2019-07-14 DIAGNOSIS — Z79899 Other long term (current) drug therapy: Secondary | ICD-10-CM | POA: Diagnosis not present

## 2019-07-14 DIAGNOSIS — S8991XA Unspecified injury of right lower leg, initial encounter: Secondary | ICD-10-CM | POA: Diagnosis present

## 2019-07-14 DIAGNOSIS — E119 Type 2 diabetes mellitus without complications: Secondary | ICD-10-CM | POA: Diagnosis not present

## 2019-07-14 DIAGNOSIS — S8001XA Contusion of right knee, initial encounter: Secondary | ICD-10-CM | POA: Insufficient documentation

## 2019-07-14 DIAGNOSIS — Y9289 Other specified places as the place of occurrence of the external cause: Secondary | ICD-10-CM | POA: Insufficient documentation

## 2019-07-14 DIAGNOSIS — I10 Essential (primary) hypertension: Secondary | ICD-10-CM | POA: Insufficient documentation

## 2019-07-14 DIAGNOSIS — Y999 Unspecified external cause status: Secondary | ICD-10-CM | POA: Insufficient documentation

## 2019-07-14 DIAGNOSIS — Z87891 Personal history of nicotine dependence: Secondary | ICD-10-CM | POA: Diagnosis not present

## 2019-07-14 DIAGNOSIS — Z7982 Long term (current) use of aspirin: Secondary | ICD-10-CM | POA: Insufficient documentation

## 2019-07-14 DIAGNOSIS — W01198A Fall on same level from slipping, tripping and stumbling with subsequent striking against other object, initial encounter: Secondary | ICD-10-CM | POA: Insufficient documentation

## 2019-07-14 NOTE — Discharge Instructions (Signed)
You were seen in the emergency department for continued pain in your right knee after a fall.  You had x-rays of the knee did not show any obvious fracture.  We have scheduled you an ultrasound for tomorrow because the ultrasound technicians are not here today.  You should use Tylenol and ibuprofen for pain.  Follow-up with your doctor or return if any worsening symptoms.

## 2019-07-14 NOTE — ED Provider Notes (Signed)
[facility] EMERGENCY DEPARTMENT Provider Note   CSN: [id number] Arrival date & time: 07/14/19  1354     History   Chief Complaint Chief Complaint  Patient presents with  . Leg Pain    HPI Stephanie [name] is a 68 y.o. female.  She said she had a mechanical fall about 8 days ago striking her right knee on the ground.  Since then she has had right knee pain and lower leg pain worse with movement and palpation.  She said starting last night the lower leg started hurting more and radiating down to the foot.  She has had a history of a blood clot and she wants to make sure it is not a blood clot.  Not on anticoagulation.  No chest pain or shortness of breath.  No numbness or weakness.     The history is provided by the patient.  Leg Pain Location:  Knee and leg Leg location:  R lower leg Knee location:  R knee Pain details:    Quality:  Aching   Severity:  Severe   Onset quality:  Sudden   Duration:  8 days   Timing:  Intermittent   Progression:  Waxing and waning Chronicity:  New Dislocation: no   Foreign body present:  No foreign bodies Prior injury to area:  No Relieved by:  Rest and elevation Worsened by:  Bearing weight and activity Ineffective treatments:  None tried Associated symptoms: no back pain, no decreased ROM, no fever, no numbness and no tingling     Past Medical History:  Diagnosis Date  . Anxiety   . Depression   . DVT (deep venous thrombosis) (HCC)    "LLE; same time as I had hysterectomy"  . Family history of adverse reaction to anesthesia    "my momma gets real sick" (07/27/2018)  . GERD (gastroesophageal reflux disease)   . HOH (hard of hearing)    hears better out of left.  . Hypertension   . Seasonal asthma   . Type 2 diabetes, diet controlled [facility])     Patient Active Problem List   Diagnosis Date Noted  . S/P partial resection of colon 07/26/2018  . SARCOIDOSIS 03/15/2007  . METABOLIC SYNDROME X 03/15/2007  . G E R D  03/15/2007  . SYMPTOM, POLYDIPSIA 03/15/2007  . SYMPTOM, HEADACHE 03/15/2007    Past Surgical History:  Procedure Laterality Date  . ABDOMINAL HYSTERECTOMY    . CARPAL TUNNEL RELEASE Bilateral   . COLECTOMY  07/26/2018   "for diverticulitis"  . COLON RESECTION N/A 07/26/2018   Procedure: LAPAROSCOPIC SIGMOID COLON RESECTION ERAS PATHWAY;  Surgeon: Axel Filler, MD;  Location: [facility] OR;  Service: General;  Laterality: N/A;  . COLONOSCOPY W/ BIOPSIES AND POLYPECTOMY     "benign" (07/27/2018)  . LAPAROSCOPIC CHOLECYSTECTOMY    . REFRACTIVE SURGERY Bilateral 2006  . SIGMOIDOSCOPY N/A 07/26/2018   Procedure: SIGMOIDOSCOPY;  Surgeon: Axel Filler, MD;  Location: [facility] OR;  Service: General;  Laterality: N/A;  . TUBAL LIGATION       OB History   No obstetric history on file.      Home Medications    Prior to Admission medications   Medication Sig Start Date End Date Taking? Authorizing Provider  albuterol (PROVENTIL HFA;VENTOLIN HFA) 108 (90 BASE) MCG/ACT inhaler Inhale 2 puffs into the lungs every 6 (six) hours as needed for wheezing or shortness of breath.     [provider]  amoxicillin-clavulanate (AUGMENTIN) 875-125 MG tablet Take  1 tablet by mouth every 12 (twelve) hours. Patient not taking: Reported on 07/12/2018 09/29/17   Kinnie Feil, PA-C  aspirin EC 81 MG tablet Take 81 mg by mouth daily.    [provider]  atenolol (TENORMIN) 25 MG tablet Take 25 mg by mouth daily.     [provider]  Cholecalciferol (VITAMIN D) 50 MCG (2000 UT) CAPS Take 2,000 Units by mouth daily.    [provider]  diphenhydrAMINE (BENADRYL) 25 MG tablet Take 1 tablet (25 mg total) by mouth every 6 (six) hours. 09/23/18   Fredia Sorrow, MD  famotidine (PEPCID) 20 MG tablet Take 1 tablet (20 mg total) by mouth 2 (two) times daily. 09/23/18   Fredia Sorrow, MD  hydrochlorothiazide (MICROZIDE) 12.5 MG capsule Take 12.5 mg by mouth daily.    [provider]  lisinopril (PRINIVIL,ZESTRIL) 40 MG tablet Take 40 mg by mouth daily.    [provider]  ondansetron (ZOFRAN ODT) 4 MG disintegrating tablet Take 1 tablet (4 mg total) by mouth every 8 (eight) hours as needed for nausea or vomiting. Patient not taking: Reported on 07/12/2018 09/29/17   Kinnie Feil, PA-C  ondansetron (ZOFRAN) 8 MG tablet Take 1 tablet (8 mg total) by mouth every 4 (four) hours as needed for nausea. Patient not taking: Reported on 07/12/2018 09/23/12   Veryl Speak, MD  pantoprazole (PROTONIX) 20 MG tablet Take 20 mg by mouth daily.    [provider]  predniSONE (DELTASONE) 10 MG tablet Take 4 tablets (40 mg total) by mouth daily. 09/23/18   Fredia Sorrow, MD  RABEprazole (ACIPHEX) 20 MG tablet Take 20 mg by mouth daily.    [provider]  sertraline (ZOLOFT) 100 MG tablet Take 100 mg by mouth daily.    [provider]  traMADol (ULTRAM) 50 MG tablet Take 1 tablet (50 mg total) by mouth every 6 (six) hours as needed for moderate pain. 07/28/18   Ralene Ok, MD  zolpidem (AMBIEN) 10 MG tablet Take 10 mg by mouth at bedtime. 06/14/18   [provider]    Family History No family history on file.  Social History Social History   Tobacco Use  . Smoking status: Former Smoker    Packs/day: 1.00    Types: Cigarettes    Quit date: 1984    Years since quitting: 36.9  . Smokeless tobacco: Never Used  Substance Use Topics  . Alcohol use: No  . Drug use: Never     Allergies   Shellfish allergy, Albuterol, and Flagyl [metronidazole]   Review of Systems Review of Systems  Constitutional: Negative for fever.  HENT: Negative for sore throat.   Eyes: Negative for visual disturbance.  Respiratory: Negative for shortness of breath.   Cardiovascular: Negative for chest pain.  Gastrointestinal: Negative for abdominal pain.  Genitourinary: Negative for dysuria.  Musculoskeletal: Negative for back pain.   Skin: Negative for rash.  Neurological: Negative for headaches.     Physical Exam Updated Vital Signs BP (!) 159/78 (BP Location: Left Arm)   Pulse 60   Temp 99 F (37.2 C) (Oral)   Resp 18   Ht 4\' 11"  (1.499 m)   Wt 94.8 kg   SpO2 97%   BMI 42.21 kg/m   Physical Exam Vitals signs and nursing note reviewed.  Constitutional:      General: She is not in acute distress.    Appearance: She is well-developed.  HENT:     Head:  Normocephalic and atraumatic.  Eyes:     Conjunctiva/sclera: Conjunctivae normal.  Neck:     Musculoskeletal: Neck supple.  Cardiovascular:     Rate and Rhythm: Normal rate and regular rhythm.     Heart sounds: No murmur.  Pulmonary:     Effort: Pulmonary effort is normal. No respiratory distress.     Breath sounds: Normal breath sounds.  Abdominal:     Palpations: Abdomen is soft.     Tenderness: There is no abdominal tenderness.  Musculoskeletal:        General: Tenderness and signs of injury present.     Comments: Right lower extremity full range of motion hip and knee.  She is tender over her patella and her tibial plateau.  No particular swelling or calf tenderness.  No cords appreciated.  Distal pulses sensation and motor intact.  Skin:    General: Skin is warm and dry.     Capillary Refill: Capillary refill takes less than 2 seconds.  Neurological:     General: No focal deficit present.     Mental Status: She is alert.     Sensory: No sensory deficit.     Motor: No weakness.      ED Treatments / Results  Labs (all labs ordered are listed, but only abnormal results are displayed) Labs Reviewed - No data to display  EKG None  Radiology Dg Tibia/fibula Right  Result Date: 07/14/2019 CLINICAL DATA:  Fall with right leg pain, 1 week ago. EXAM: RIGHT TIBIA AND FIBULA - 2 VIEW COMPARISON:  Knee of the same date. FINDINGS: AP and lateral views of the right tibia and fibula show some irregularity of the posterior fibula on the lateral  view at the level of the lateral malleolus. Soft tissue swelling suggested over the medial foot. IMPRESSION: Signs of irregularity of the lateral malleolus on the lateral view only of the tibia and fibula. Correlate with any point tenderness with ankle imaging as indicated. Soft tissue swelling also suggested over the medial foot. Clinical correlation suggested. Electronically Signed   By: Donzetta Kohut M.D.   On: 07/14/2019 14:33   Dg Knee Complete 4 Views Right  Result Date: 07/14/2019 CLINICAL DATA:  Ongoing right leg pain and swelling after fall 1 week ago. EXAM: RIGHT KNEE - COMPLETE 4+ VIEW COMPARISON:  None FINDINGS: Tricompartmental osteoarthritic changes with peaking of tibial spine. Degenerative changes most pronounced in medial and patellofemoral compartments. No signs of fracture or joint effusion. No plain film signs of soft tissue swelling. IMPRESSION: Tricompartmental degenerative changes without acute osseous abnormality. Electronically Signed   By: Donzetta Kohut M.D.   On: 07/14/2019 14:30    Procedures Procedures (including critical care time)  Medications Ordered in ED Medications - No data to display   Initial Impression / Assessment and Plan / ED Course  I have reviewed the triage vital signs and the nursing notes.  Pertinent labs & imaging results that were available during my care of the patient were reviewed by me and considered in my medical decision making (see chart for details).  Clinical Course as of Jul 13 1728  Thu Jul 14, 2019  [id number] 68 year old female with injury to right knee and lower leg after a fall 8 days ago.  Differential includes fracture, contusion, DVT.  Ultrasound not available today due to the holiday.  We will get x-rays and if negative have follow-up tomorrow for an outpatient ultrasound.   [MB]    Clinical Course User Index [MB]  Terrilee FilesButler, Michael C, MD        Final Clinical Impressions(s) / ED Diagnoses   Final diagnoses:  Contusion of  right knee, initial encounter    ED Discharge Orders         Ordered    US Venous Img Lower Unilateral Right     07/14/19 1459           Terrilee FilesButler, Michael C, MD 07/14/19 1729

## 2019-07-14 NOTE — ED Triage Notes (Signed)
Ongoing R leg pain after falling 1 week ago.

## 2019-07-15 ENCOUNTER — Ambulatory Visit (HOSPITAL_BASED_OUTPATIENT_CLINIC_OR_DEPARTMENT_OTHER)
Admission: RE | Admit: 2019-07-15 | Discharge: 2019-07-15 | Disposition: A | Discharge status: Home / Self Care | Payer: BC Managed Care – PPO | Source: Ambulatory Visit | Attending: Family Medicine | Admitting: Family Medicine

## 2019-07-15 DIAGNOSIS — Z86718 Personal history of other venous thrombosis and embolism: Secondary | ICD-10-CM | POA: Diagnosis not present

## 2019-07-15 DIAGNOSIS — M79604 Pain in right leg: Secondary | ICD-10-CM | POA: Diagnosis not present

## 2020-08-06 IMAGING — DX DG KNEE COMPLETE 4+V*R*
4 series · 4 of 4 positions shown · non-contrast
Comparison: None

CLINICAL DATA: Ongoing right leg pain and swelling after fall 1
week ago.

EXAM:
RIGHT KNEE - COMPLETE 4+ VIEW

[knee ap]
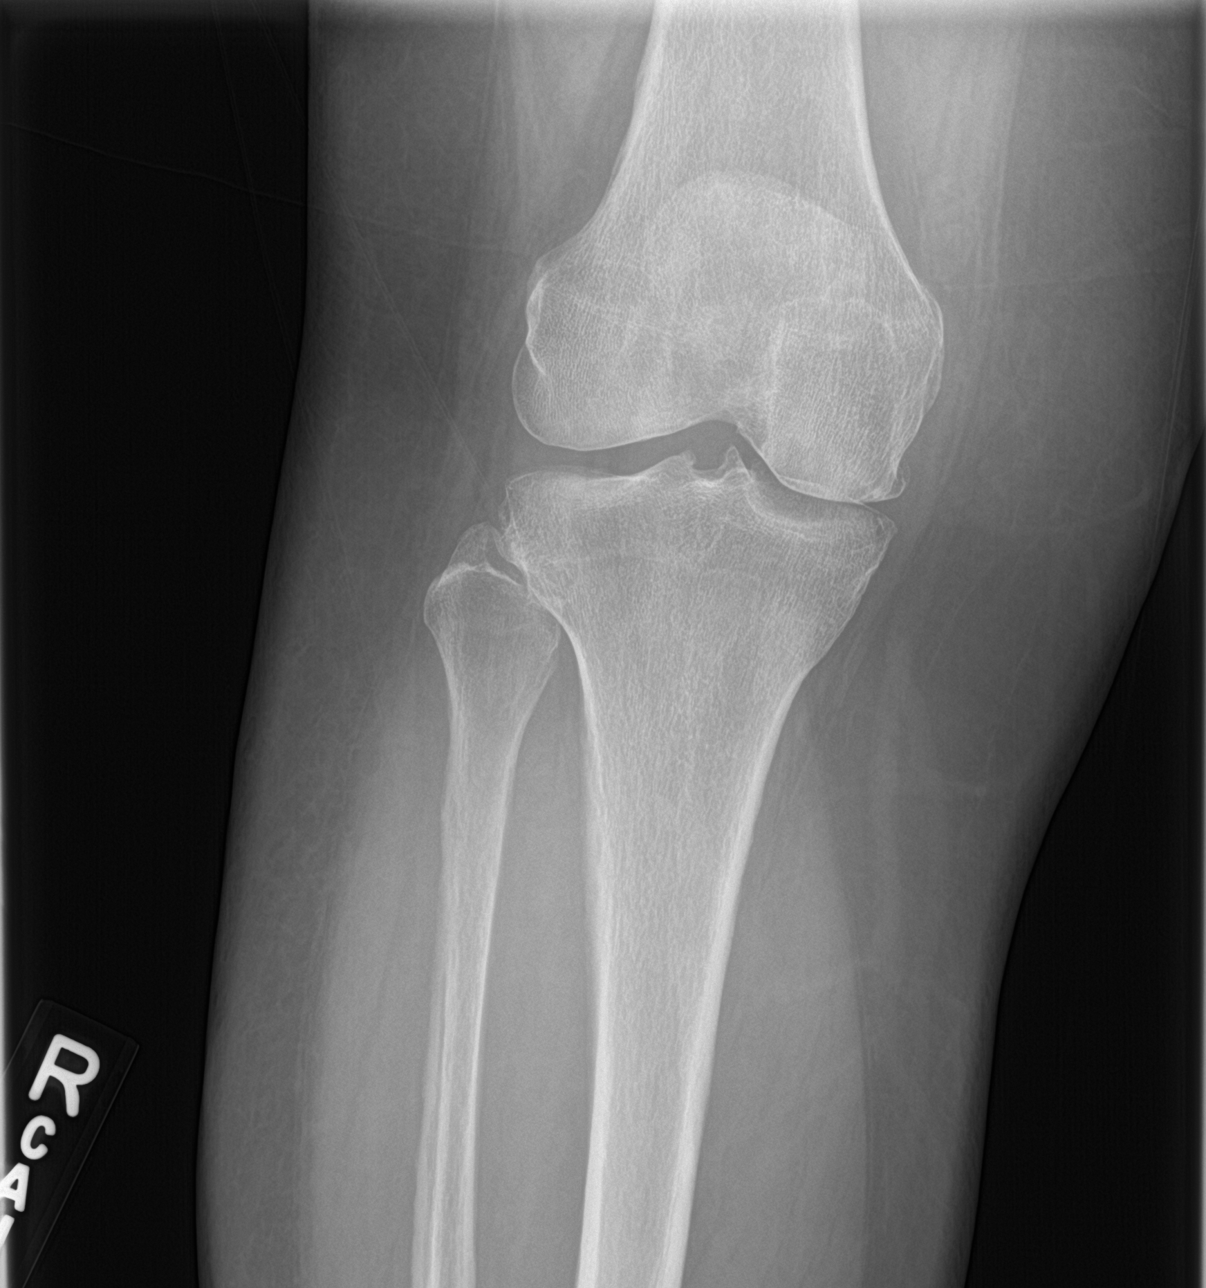

[knee lat]
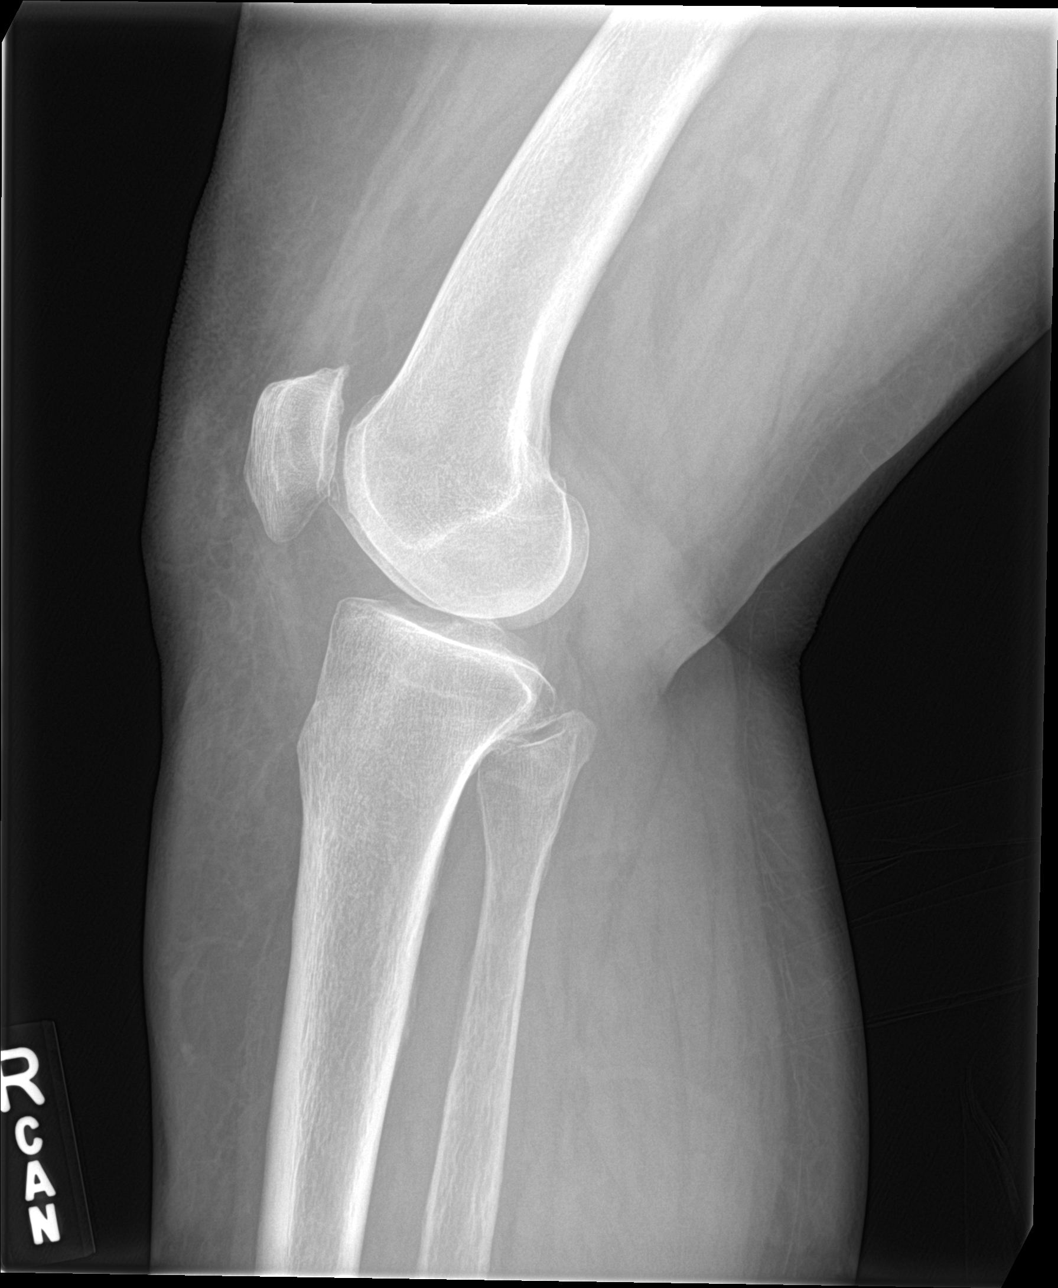

[knee obl (1 of 2)]
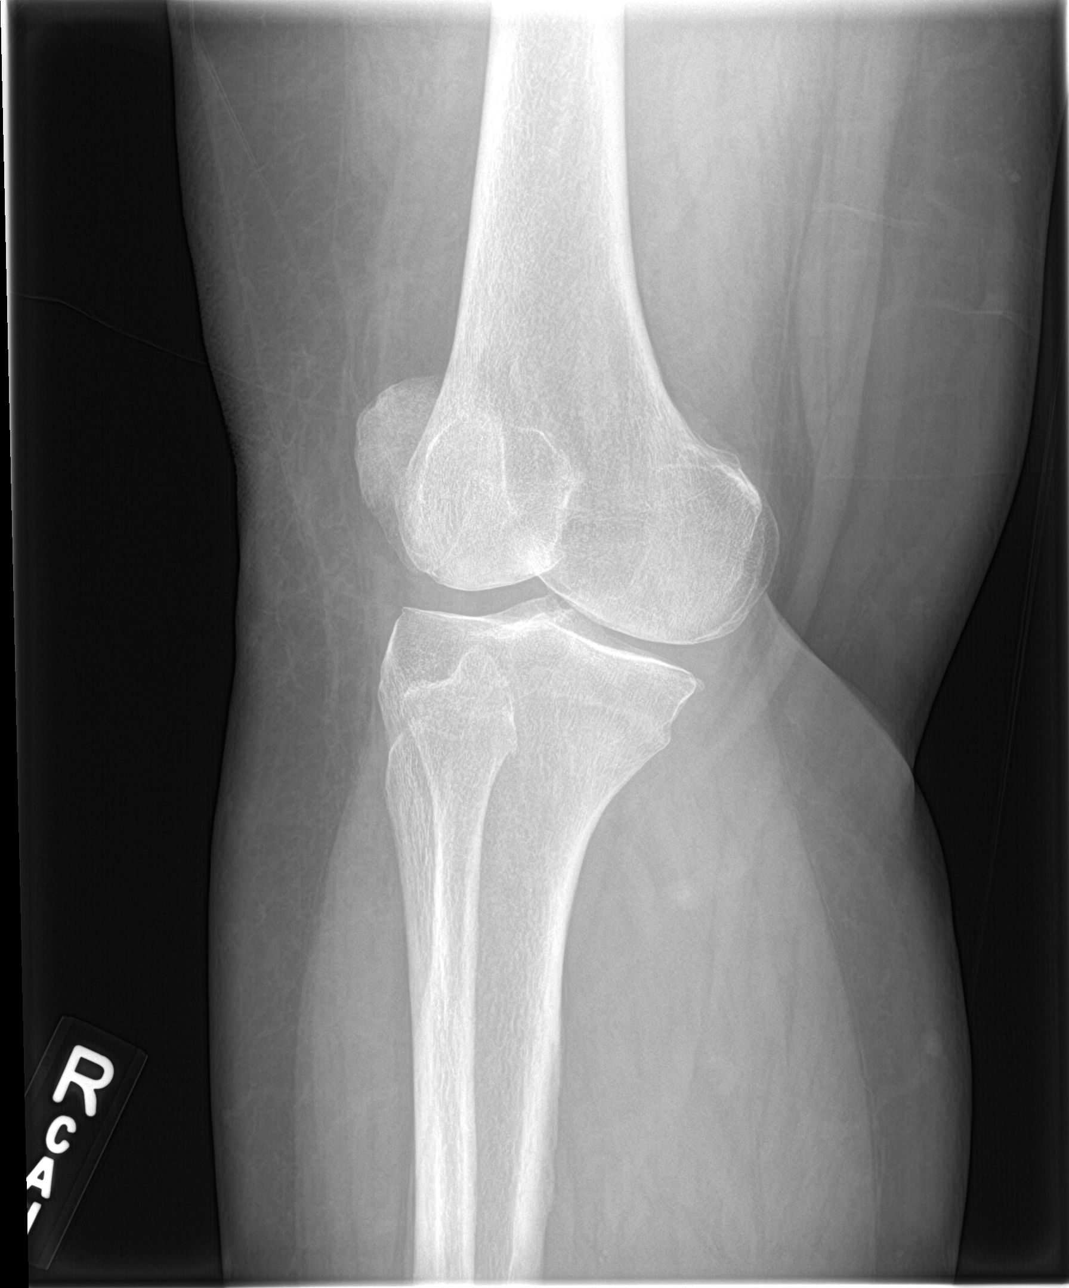

[knee obl (2 of 2)]
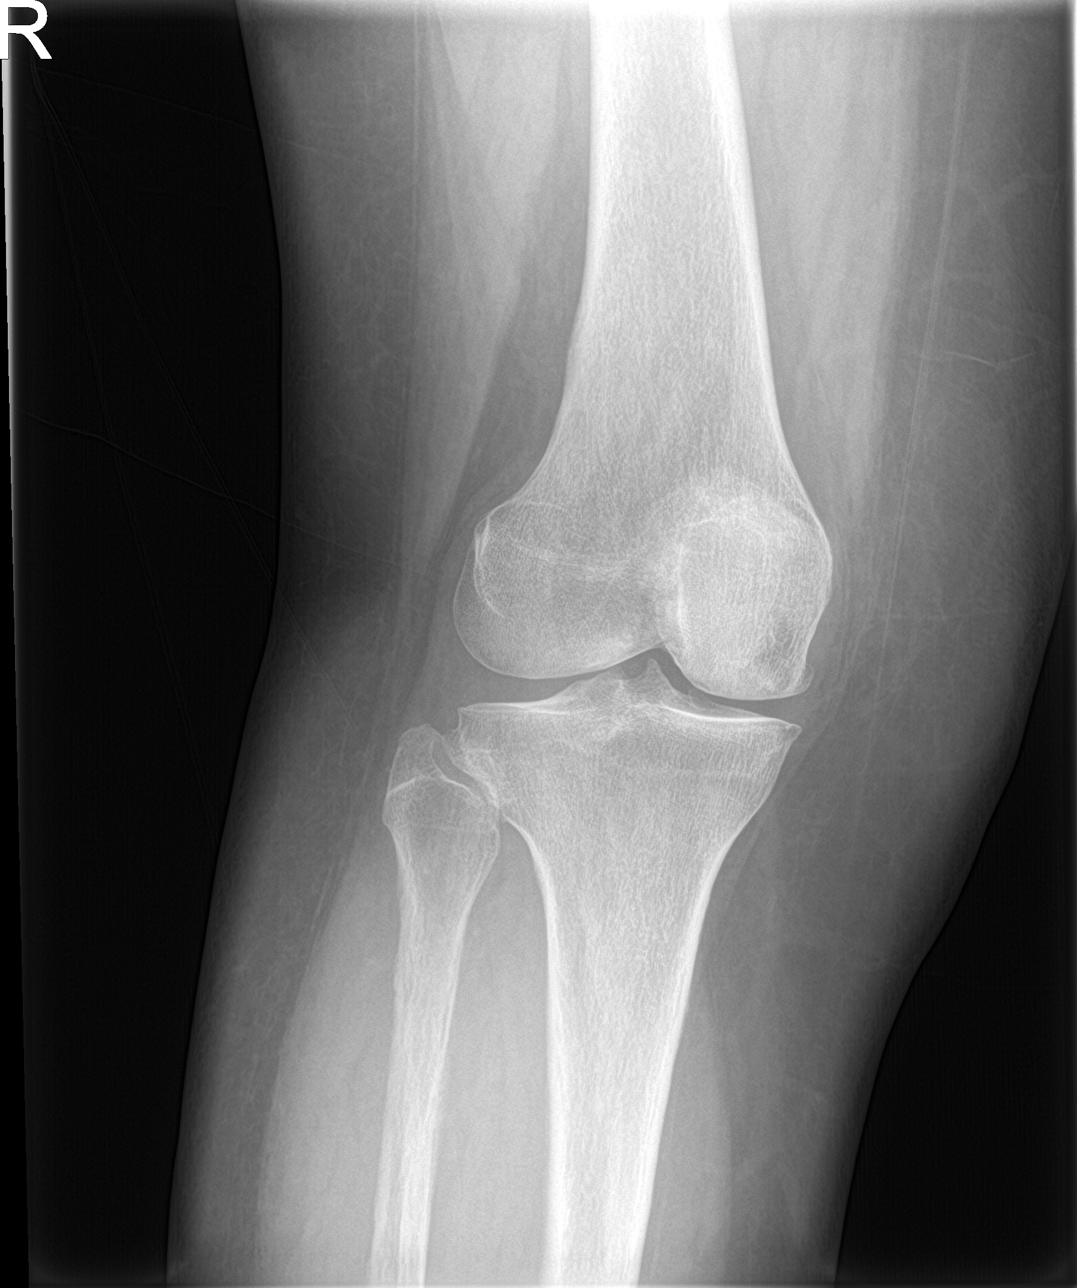

[4 of 4 positions shown; findings below may reference images not displayed]

FINDINGS: Tricompartmental osteoarthritic changes with peaking of tibial
spine. Degenerative changes most pronounced in medial and
patellofemoral compartments. No signs of fracture or joint
effusion. No plain film signs of soft tissue swelling.
IMPRESSION: Tricompartmental degenerative changes without acute osseous
abnormality.

## 2020-08-07 IMAGING — US US EXTREM LOW VENOUS*R*
1 series · 13 of 24 positions shown · non-contrast
Comparison: Right lower extremity venous Doppler
ultrasound-09/17/2009

CLINICAL DATA: Right lower extremity pain. Recent fall. History of
previous DVT. Evaluate for acute or chronic DVT.



[Series 1: us extrem low venous*right* · 13 of 31 slices shown]
[im 1/31]
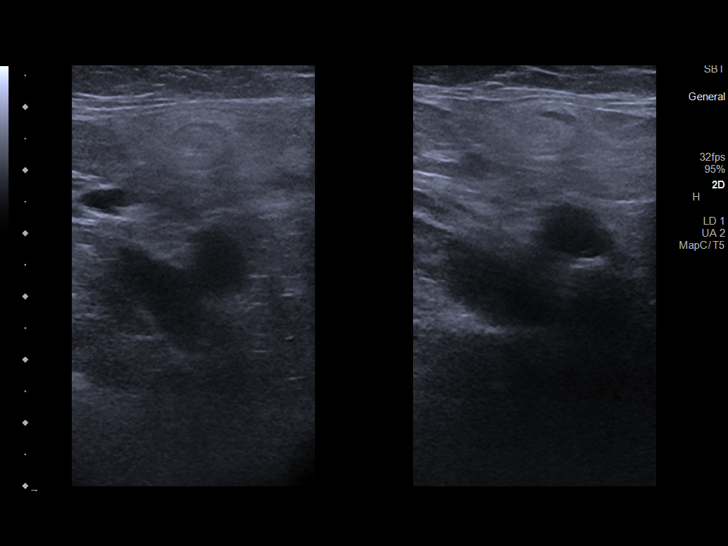
[im 3/31]
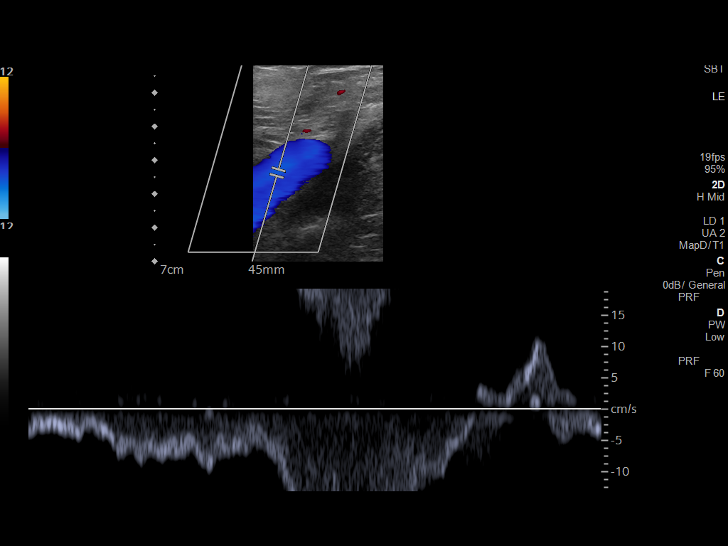
[im 6/31]
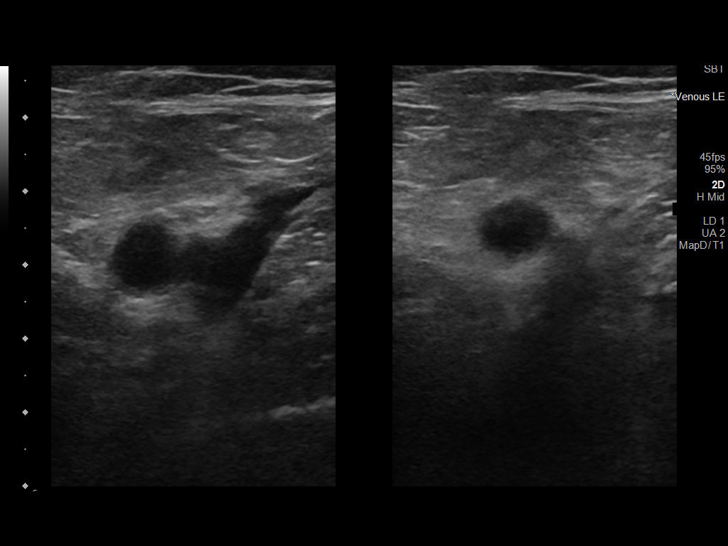
[im 8/31]
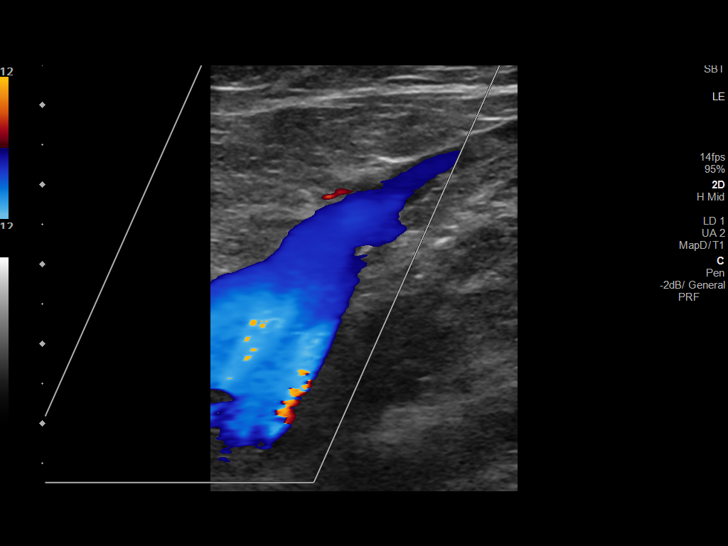
[im 11/31]
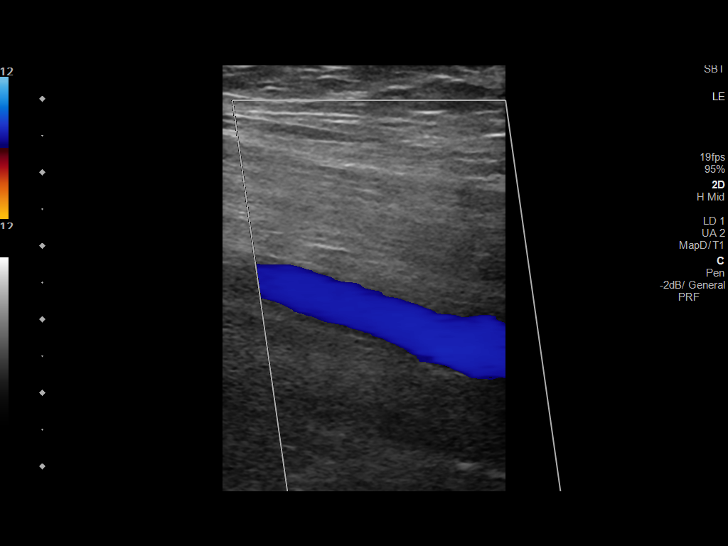
[im 14/31]
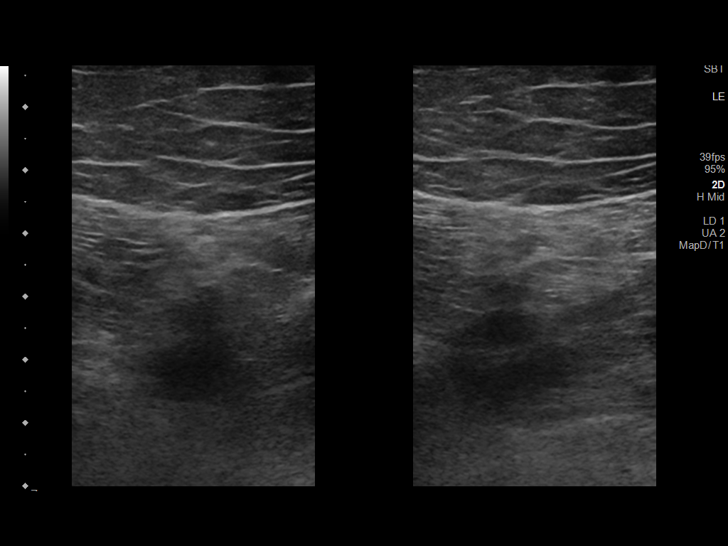
[im 16/31]
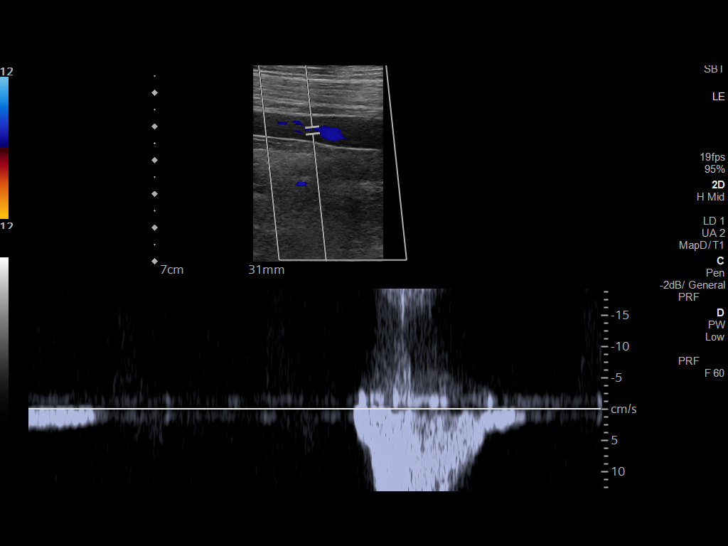
[im 17/31]
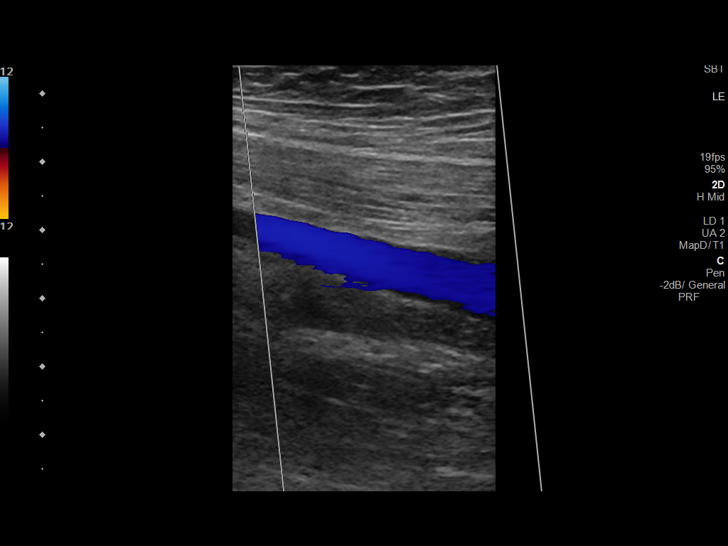
[im 20/31]
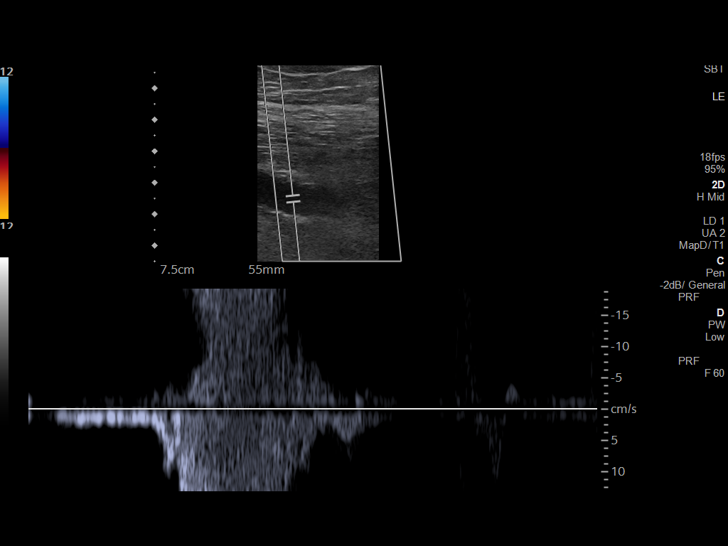
[im 23/31]
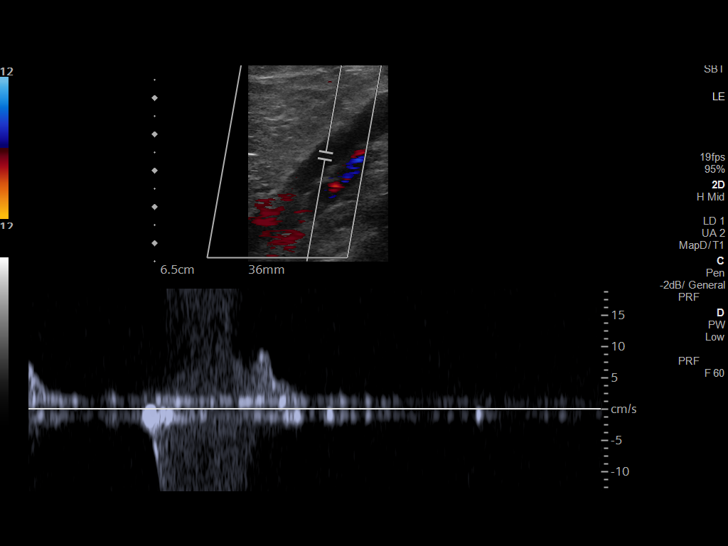
[im 25/31]
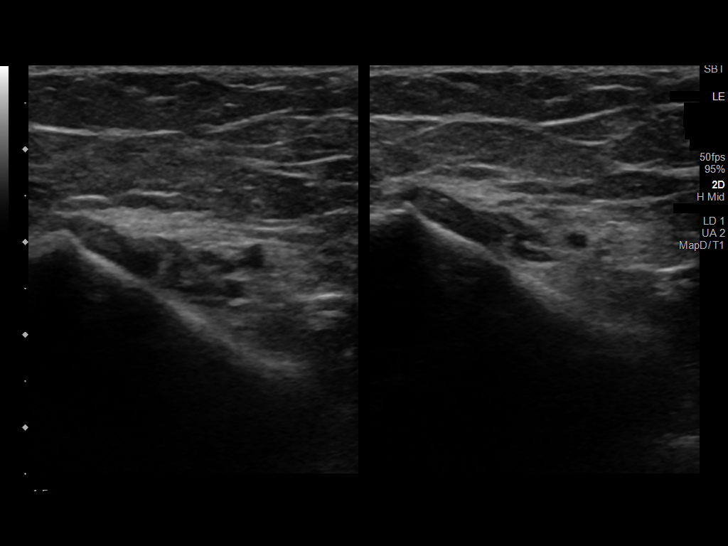
[im 28/31]
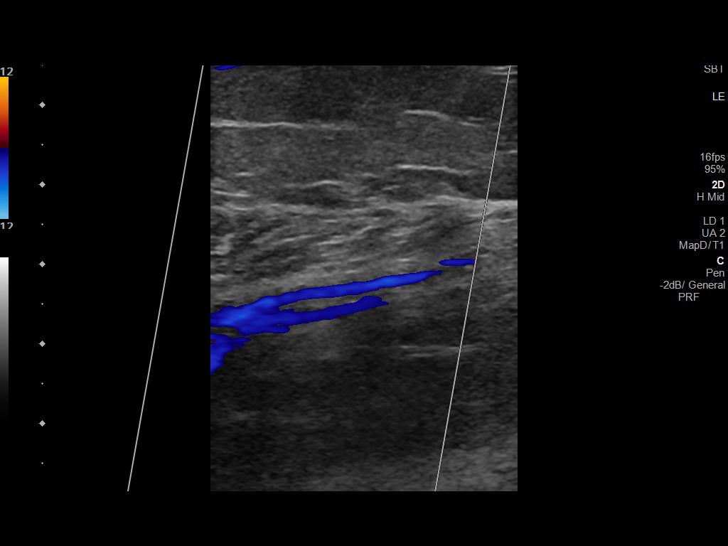
[im 31/31]
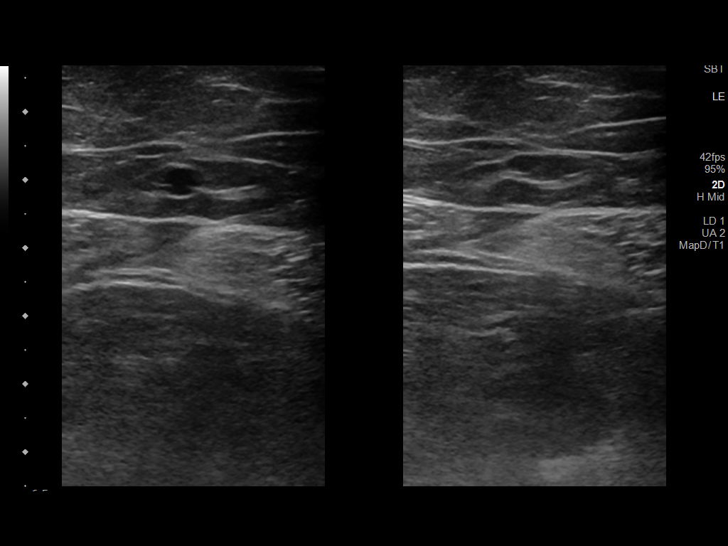

[13 of 24 positions shown; findings below may reference images not displayed]

FINDINGS: Contralateral Common Femoral Vein: Respiratory phasicity is normal
and symmetric with the symptomatic side. No evidence of thrombus.
Normal compressibility.

Common Femoral Vein: No evidence of thrombus. Normal
compressibility, respiratory phasicity and response to augmentation.

Saphenofemoral Junction: No evidence of thrombus. Normal
compressibility and flow on color Doppler imaging.

Profunda Femoral Vein: No evidence of thrombus. Normal
compressibility and flow on color Doppler imaging.

Femoral Vein: No evidence of thrombus. Normal compressibility,
respiratory phasicity and response to augmentation.

Popliteal Vein: No evidence of thrombus. Normal compressibility,
respiratory phasicity and response to augmentation.

Calf Veins: No evidence of thrombus. Normal compressibility and flow
on color Doppler imaging.

Superficial Great Saphenous Vein: No evidence of thrombus. Normal
compressibility.

Venous Reflux:  None.

Other Findings:  None.
IMPRESSION: No evidence of acute or chronic DVT within the right lower
extremity.

## 2020-10-31 DIAGNOSIS — Z03818 Encounter for observation for suspected exposure to other biological agents ruled out: Secondary | ICD-10-CM | POA: Diagnosis not present

## 2020-10-31 DIAGNOSIS — R6889 Other general symptoms and signs: Secondary | ICD-10-CM | POA: Diagnosis not present

## 2020-11-01 DIAGNOSIS — J069 Acute upper respiratory infection, unspecified: Secondary | ICD-10-CM | POA: Diagnosis not present

## 2020-12-06 DIAGNOSIS — Z23 Encounter for immunization: Secondary | ICD-10-CM | POA: Diagnosis not present

## 2020-12-06 DIAGNOSIS — E1169 Type 2 diabetes mellitus with other specified complication: Secondary | ICD-10-CM | POA: Diagnosis not present

## 2020-12-06 DIAGNOSIS — Z0000 Encounter for general adult medical examination without abnormal findings: Secondary | ICD-10-CM | POA: Diagnosis not present

## 2020-12-06 DIAGNOSIS — E559 Vitamin D deficiency, unspecified: Secondary | ICD-10-CM | POA: Diagnosis not present

## 2020-12-06 DIAGNOSIS — G4700 Insomnia, unspecified: Secondary | ICD-10-CM | POA: Diagnosis not present

## 2020-12-06 DIAGNOSIS — I10 Essential (primary) hypertension: Secondary | ICD-10-CM | POA: Diagnosis not present

## 2020-12-06 DIAGNOSIS — J45909 Unspecified asthma, uncomplicated: Secondary | ICD-10-CM | POA: Diagnosis not present

## 2020-12-06 DIAGNOSIS — Z7984 Long term (current) use of oral hypoglycemic drugs: Secondary | ICD-10-CM | POA: Diagnosis not present

## 2020-12-06 DIAGNOSIS — E7800 Pure hypercholesterolemia, unspecified: Secondary | ICD-10-CM | POA: Diagnosis not present

## 2020-12-06 LAB — BASIC METABOLIC PANEL: Glucose: 140

## 2020-12-06 LAB — MICROALBUMIN, URINE: Microalb, Ur: 1.92

## 2020-12-06 LAB — VITAMIN D 25 HYDROXY (VIT D DEFICIENCY, FRACTURES): Vit D, 25-Hydroxy: 39.4

## 2020-12-06 LAB — HEMOGLOBIN A1C: Hemoglobin A1C: 6.5

## 2021-02-11 ENCOUNTER — Encounter (INDEPENDENT_AMBULATORY_CARE_PROVIDER_SITE_OTHER): Payer: Self-pay

## 2021-02-12 ENCOUNTER — Other Ambulatory Visit: Payer: Self-pay

## 2021-02-12 ENCOUNTER — Encounter (INDEPENDENT_AMBULATORY_CARE_PROVIDER_SITE_OTHER): Payer: Self-pay | Admitting: Family Medicine

## 2021-02-12 ENCOUNTER — Ambulatory Visit (INDEPENDENT_AMBULATORY_CARE_PROVIDER_SITE_OTHER): Payer: Medicare Other | Admitting: Family Medicine

## 2021-02-12 VITALS — BP 135/75 | HR 53 | Temp 97.5°F | Ht <= 58 in | Wt 228.0 lb

## 2021-02-12 DIAGNOSIS — E1159 Type 2 diabetes mellitus with other circulatory complications: Secondary | ICD-10-CM

## 2021-02-12 DIAGNOSIS — R5383 Other fatigue: Secondary | ICD-10-CM | POA: Diagnosis not present

## 2021-02-12 DIAGNOSIS — I152 Hypertension secondary to endocrine disorders: Secondary | ICD-10-CM | POA: Diagnosis not present

## 2021-02-12 DIAGNOSIS — R0602 Shortness of breath: Secondary | ICD-10-CM | POA: Diagnosis not present

## 2021-02-12 DIAGNOSIS — Z1331 Encounter for screening for depression: Secondary | ICD-10-CM

## 2021-02-12 DIAGNOSIS — E1169 Type 2 diabetes mellitus with other specified complication: Secondary | ICD-10-CM | POA: Diagnosis not present

## 2021-02-12 DIAGNOSIS — E785 Hyperlipidemia, unspecified: Secondary | ICD-10-CM | POA: Diagnosis not present

## 2021-02-12 DIAGNOSIS — E559 Vitamin D deficiency, unspecified: Secondary | ICD-10-CM

## 2021-02-12 DIAGNOSIS — E538 Deficiency of other specified B group vitamins: Secondary | ICD-10-CM | POA: Diagnosis not present

## 2021-02-12 DIAGNOSIS — R6889 Other general symptoms and signs: Secondary | ICD-10-CM | POA: Diagnosis not present

## 2021-02-12 DIAGNOSIS — Z0289 Encounter for other administrative examinations: Secondary | ICD-10-CM

## 2021-02-12 DIAGNOSIS — Z6841 Body Mass Index (BMI) 40.0 and over, adult: Secondary | ICD-10-CM

## 2021-02-12 DIAGNOSIS — E782 Mixed hyperlipidemia: Secondary | ICD-10-CM | POA: Diagnosis not present

## 2021-02-12 DIAGNOSIS — E119 Type 2 diabetes mellitus without complications: Secondary | ICD-10-CM | POA: Insufficient documentation

## 2021-02-13 LAB — LIPID PANEL WITH LDL/HDL RATIO
Cholesterol, Total: 203 mg/dL — ABNORMAL HIGH (ref 100–199)
HDL: 95 mg/dL (ref >39)
LDL Chol Calc (NIH): 85 mg/dL (ref 0–99)
LDL/HDL Ratio: 0.9 ratio (ref 0.0–3.2)
Triglycerides: 135 mg/dL (ref 0–149)
VLDL Cholesterol Cal: 23 mg/dL (ref 5–40)

## 2021-02-13 LAB — COMPREHENSIVE METABOLIC PANEL
ALT: 26 IU/L (ref 0–32)
AST: 26 IU/L (ref 0–40)
Albumin/Globulin Ratio: 1.7 (ref 1.2–2.2)
Albumin: 4.5 g/dL (ref 3.8–4.8)
Alkaline Phosphatase: 56 IU/L (ref 44–121)
BUN/Creatinine Ratio: 22 (ref 12–28)
BUN: 17 mg/dL (ref 8–27)
Bilirubin Total: 0.3 mg/dL (ref 0.0–1.2)
CO2: 26 mmol/L (ref 20–29)
Calcium: 10.1 mg/dL (ref 8.7–10.3)
Chloride: 98 mmol/L (ref 96–106)
Creatinine, Ser: 0.78 mg/dL (ref 0.57–1.00)
Globulin, Total: 2.7 g/dL (ref 1.5–4.5)
Glucose: 93 mg/dL (ref 65–99)
Potassium: 4 mmol/L (ref 3.5–5.2)
Sodium: 141 mmol/L (ref 134–144)
Total Protein: 7.2 g/dL (ref 6.0–8.5)
eGFR: 83 mL/min/{1.73_m2} (ref >59)

## 2021-02-13 LAB — CBC WITH DIFFERENTIAL/PLATELET
Basophils Absolute: 0 10*3/uL (ref 0.0–0.2)
Basos: 1 %
EOS (ABSOLUTE): 0.1 10*3/uL (ref 0.0–0.4)
Eos: 2 %
Hematocrit: 37.3 % (ref 34.0–46.6)
Hemoglobin: 12.4 g/dL (ref 11.1–15.9)
Immature Grans (Abs): 0 10*3/uL (ref 0.0–0.1)
Immature Granulocytes: 0 %
Lymphocytes Absolute: 1.5 10*3/uL (ref 0.7–3.1)
Lymphs: 29 %
MCH: 30.5 pg (ref 26.6–33.0)
MCHC: 33.2 g/dL (ref 31.5–35.7)
MCV: 92 fL (ref 79–97)
Monocytes Absolute: 0.4 10*3/uL (ref 0.1–0.9)
Monocytes: 7 %
Neutrophils Absolute: 3.3 10*3/uL (ref 1.4–7.0)
Neutrophils: 61 %
Platelets: 256 10*3/uL (ref 150–450)
RBC: 4.06 x10E6/uL (ref 3.77–5.28)
RDW: 13.4 % (ref 11.7–15.4)
WBC: 5.3 10*3/uL (ref 3.4–10.8)

## 2021-02-13 LAB — INSULIN, RANDOM: INSULIN: 15.6 u[IU]/mL (ref 2.6–24.9)

## 2021-02-13 LAB — T3: T3, Total: 124 ng/dL (ref 71–180)

## 2021-02-13 LAB — T4, FREE: Free T4: 1.08 ng/dL (ref 0.82–1.77)

## 2021-02-13 LAB — TSH: TSH: 1.85 u[IU]/mL (ref 0.450–4.500)

## 2021-02-13 LAB — FOLATE: Folate: 10.6 ng/mL (ref >3.0)

## 2021-02-13 LAB — VITAMIN B12: Vitamin B-12: 1124 pg/mL (ref 232–1245)

## 2021-02-21 NOTE — Progress Notes (Signed)
Dear Dr. Izola PriceMyers,   Thank you for referring Stephanie CoyCathy T [name] to our clinic. The following note includes my evaluation and treatment recommendations.  Chief Complaint:   OBESITY Stephanie [name] (MR# [id number]) is a 70 y.o. female who presents for evaluation and treatment of obesity and related comorbidities. Current BMI is Body mass index is 47.65 kg/m. Stephanie [name] has been struggling with her weight for many years and has been unsuccessful in either losing weight, maintaining weight loss, or reaching her healthy weight goal.  Stephanie [name] is currently in the action stage of change and ready to dedicate time achieving and maintaining a healthier weight. Stephanie [name] is interested in becoming our patient and working on intensive lifestyle modifications including (but not limited to) diet and exercise for weight loss.  Stephanie [name] lives with her husband Stephanie [name].  Retired.  Craves potatoes and bread, especially in the afternoon.  Snacks on chips and cheese doodles.  Skips lunch 3 days per week.  Used keto and low fat in the past, and low fat worked for her.  Keto caused gallstones.  She says that overeating is her worst habit.  Stephanie [name] habits were reviewed today and are as follows: her desired weight loss is 113 pounds, she started gaining weight 40 years ago, her heaviest weight ever was 249 pounds, she craves potatoes and bread, she snacks frequently in the evenings, she skips lunch frequently, she frequently makes poor food choices, she frequently eats larger portions than normal, and she struggles with emotional eating.  Depression Screen Stephanie [name]'s Food and Mood (modified PHQ-9) score was 13.  Depression screen Stephanie [name] 2/9 02/12/2021  Decreased Interest 2  Down, Depressed, Hopeless 1  PHQ - 2 Score 3  Altered sleeping 3  Tired, decreased energy 3  Change in appetite 2  Feeling bad or failure about yourself  2  Trouble concentrating 0  Moving slowly or fidgety/restless 0  Suicidal thoughts 0  PHQ-9 Score 13  Difficult  doing work/chores Not difficult at all   Assessment/Plan:   Orders Placed This Encounter  Procedures   Vitamin B12   CBC with Differential/Platelet   Comprehensive metabolic panel   Lipid Panel With LDL/HDL Ratio   T3   T4, free   TSH   Insulin, random   Folate   EKG 12-Lead   1. Other fatigue Stephanie [name] denies daytime somnolence and denies waking up still tired. Patent has a history of symptoms of snoring. Stephanie [name] generally gets 7 or 8 hours of sleep per night, and states that she has generally restful sleep. Snoring is present. Apneic episodes are not present. Epworth Sleepiness Score is 7.  Stephanie [name] does feel that her weight is causing her energy to be lower than it should be. Fatigue may be related to obesity, depression or many other causes. Labs will be ordered, and in the meanwhile, Stephanie [name] will focus on self care including making healthy food choices, increasing physical activity and focusing on stress reduction.  Check EKG and labs today.  - EKG 12-Lead - CBC with Differential/Platelet - T3 - T4, free - TSH  2. Shortness of breath on exertion Stephanie [name] notes increasing shortness of breath with exercising and seems to be worsening over time with weight gain. She notes getting out of breath sooner with activity than she used to. This has gotten worse recently. Stephanie [name] denies shortness of breath at rest or orthopnea.  Stephanie [name] does feel that she gets out of breath more easily that she used to when she exercises. Stephanie [name]  shortness of breath appears to be obesity related and exercise induced. She has agreed to work on weight loss and gradually increase exercise to treat her exercise induced shortness of breath. Will continue to monitor closely.  Check IC and labs today.  - Lipid Panel With LDL/HDL Ratio  3. Type 2 diabetes mellitus with other specified complication, without long-term current use of insulin (HCC) Diabetes Mellitus: At goal. Medication: metformin 500 mg twice daily and pioglitazone  30 mg daily. Issues reviewed: blood sugar goals, complications of diabetes mellitus, hypoglycemia prevention and treatment, exercise, and nutrition.  FBS 110-130s.  A1c 6.5 on 12/06/2020 via [facility].  First diagnosed 3-4 years ago.  No other medications aside from Actos and metformin.  Plan: The importance of regular follow up with PCP and all other specialists as scheduled was stressed to patient today. The patient will continue to focus on protein-rich, low simple carbohydrate foods. We reviewed the importance of hydration, regular exercise for stress reduction, and restorative sleep. Check CMP and insulin level today.  Lab Results  Component Value Date   HGBA1C 6.5 12/06/2020   HGBA1C 4.9 07/19/2018   Lab Results  Component Value Date   MICROALBUR 1.92 12/06/2020   LDLCALC 85 02/12/2021   CREATININE 0.78 02/12/2021   - Comprehensive metabolic panel - Insulin, random  4. Hypertension associated with diabetes (HCC) At goal. Medications: atenolol 25 mg daily, HCTZ 12.5 mg daily, lisinopril 40 mg daily.   Plan: Avoid buying foods that are: processed, frozen, or prepackaged to avoid excess salt. We will watch for signs of hypotension as she continues lifestyle modifications. We will continue to monitor closely alongside her PCP and/or Specialist.  Regular follow up with PCP and specialists was also encouraged.   BP Readings from Last 3 Encounters:  02/12/21 135/75  07/14/19 (!) 159/78  09/23/18 124/65   Lab Results  Component Value Date   CREATININE 0.78 02/12/2021   5. Hyperlipidemia associated with type 2 diabetes mellitus (HCC) Course: Not at goal. Lipid-lowering medications: Crestor 5 mg daily.   Plan: Dietary changes: Increase soluble fiber, decrease simple carbohydrates, decrease saturated fat. Exercise changes: Moderate to vigorous-intensity aerobic activity 150 minutes per week or as tolerated. We will continue to monitor along with PCP/specialists as it pertains to her  weight loss journey.  Check FLP today.  Lab Results  Component Value Date   CHOL 203 (H) 02/12/2021   HDL 95 02/12/2021   LDLCALC 85 02/12/2021   TRIG 135 02/12/2021   Lab Results  Component Value Date   ALT 26 02/12/2021   AST 26 02/12/2021   ALKPHOS 56 02/12/2021   BILITOT 0.3 02/12/2021   The 10-year ASCVD risk score Denman George DC Jr., et al., 2013) is: 13.7%   Values used to calculate the score:     Age: 22 years     Sex: Female     Is Non-Hispanic African American: No     Diabetic: Yes     Tobacco smoker: No     Systolic Blood Pressure: 135 mmHg     Is BP treated: No     HDL Cholesterol: 95 mg/dL     Total Cholesterol: 203 mg/dL  - Lipid Panel With LDL/HDL Ratio  6. Vitamin D deficiency Not at goal.  She is taking OTC vitamin D 1,000 IU daily.  Plan: Continue current OTC vitamin D supplementation.  Follow-up for routine testing of Vitamin D, at least 2-3 times per year to avoid over-replacement.  Lab Results  Component Value Date   VD25OH 39.4 12/06/2020   7. B12 deficiency Lab Results  Component Value Date   VITAMINB12 1,124 02/12/2021   Supplementation: Vitamin B12 OTC 500 mcg daily.   Plan:  Continue current treatment and we will check vitamin B12 level and folate today.  - Vitamin B12 - Folate  8. Depression screening Stephanie [name] was screened for depression as part of her new patient workup today.  PHQ-9 is 13.  She denies depression.  She is on sertraline 100 mg daily for menopause because she cannot tolerate hormone therapy.  Stephanie [name] had a positive depression screening. Depression is commonly associated with obesity and often results in emotional eating behaviors. We will monitor this closely and work on CBT to help improve the non-hunger eating patterns. Referral to Psychology may be required if no improvement is seen as she continues in our clinic.  She declines Dr. Dewaine Conger referral today.  9. Class 3 severe obesity with serious comorbidity and body mass index  (BMI) of 45.0 to 49.9 in adult, unspecified obesity type (HCC)  Stephanie [name] is currently in the action stage of change and her goal is to continue with weight loss efforts. I recommend Stephanie [name] begin the structured treatment plan as follows:  She has agreed to the Category 2 Plan with breakfast options.  Exercise goals:  As is.    Behavioral modification strategies: increasing lean protein intake, meal planning and cooking strategies, and planning for success.  She was informed of the importance of frequent follow-up visits to maximize her success with intensive lifestyle modifications for her multiple health conditions. She was informed we would discuss her lab results at her next visit unless there is a critical issue that needs to be addressed sooner. Stephanie [name] agreed to keep her next visit at the agreed upon time to discuss these results.  Objective:   Blood pressure 135/75, pulse (!) 53, temperature (!) 97.5 F (36.4 C), height 4\' 10"  (1.473 m), weight 228 lb (103.4 kg), SpO2 96 %. Body mass index is 47.65 kg/m.  EKG: Normal sinus rhythm, rate 55 bpm.  Indirect Calorimeter completed today shows a VO2 of 270 and a REE of 1858.  Her calculated basal metabolic rate is thus her basal metabolic rate is better than expected.  General: Cooperative, alert, well developed, in no acute distress. HEENT: Conjunctivae and lids unremarkable. Cardiovascular: Regular rhythm.  Lungs: Normal work of breathing. Neurologic: No focal deficits.   Lab Results  Component Value Date   CREATININE 0.78 02/12/2021   BUN 17 02/12/2021   NA 141 02/12/2021   K 4.0 02/12/2021   CL 98 02/12/2021   CO2 26 02/12/2021   Lab Results  Component Value Date   ALT 26 02/12/2021   AST 26 02/12/2021   ALKPHOS 56 02/12/2021   BILITOT 0.3 02/12/2021   Lab Results  Component Value Date   HGBA1C 6.5 12/06/2020   HGBA1C 4.9 07/19/2018   Lab Results  Component Value Date   INSULIN 15.6 02/12/2021   Lab Results   Component Value Date   TSH 1.850 02/12/2021   Lab Results  Component Value Date   CHOL 203 (H) 02/12/2021   HDL 95 02/12/2021   LDLCALC 85 02/12/2021   TRIG 135 02/12/2021   Lab Results  Component Value Date   VD25OH 39.4 12/06/2020   Lab Results  Component Value Date   WBC 5.3 02/12/2021   HGB 12.4 02/12/2021   HCT 37.3 02/12/2021   MCV 92 02/12/2021  PLT 256 02/12/2021   Attestation Statements:   This is the patient's first visit at [facility]. The patient's NEW PATIENT PACKET was reviewed at length. Included in the packet: current and past health history, medications, allergies, ROS, gynecologic history (women only), surgical history, family history, social history, weight history, weight loss surgery history (for those that have had weight loss surgery), nutritional evaluation, mood and food questionnaire, PHQ9, Epworth questionnaire, sleep habits questionnaire, patient life and health improvement goals questionnaire. These will all be scanned into the patient's chart under media.   During the visit, I independently reviewed the patient's EKG, bioimpedance scale results, and indirect calorimeter results. I used this information to tailor a meal plan for the patient that will help her to lose weight and will improve her obesity-related conditions going forward. I performed a medically necessary appropriate examination and/or evaluation. I discussed the assessment and treatment plan with the patient. The patient was provided an opportunity to ask questions and all were answered. The patient agreed with the plan and demonstrated an understanding of the instructions. Labs were ordered at this visit and will be reviewed at the next visit unless more critical results need to be addressed immediately. Clinical information was updated and documented in the EMR.   Time spent on visit including pre-visit chart review and post-visit charting and care was 60 minutes.   I,  Insurance claims handler, CMA, am acting as Energy manager for [employer], DO.  I have reviewed the above documentation for accuracy and completeness, and I agree with the above. Carlye Grippe, D.O.  The 21st Century Cures Act was signed into law in 2016 which includes the topic of electronic health records.  This provides immediate access to information in MyChart.  This includes consultation notes, operative notes, office notes, lab results and pathology reports.  If you have any questions about what you read please let us know at your next visit so we can discuss your concerns and take corrective action if need be.  We are right here with you.

## 2021-02-26 ENCOUNTER — Ambulatory Visit (INDEPENDENT_AMBULATORY_CARE_PROVIDER_SITE_OTHER): Payer: Medicare Other | Admitting: Family Medicine

## 2021-02-26 ENCOUNTER — Encounter (INDEPENDENT_AMBULATORY_CARE_PROVIDER_SITE_OTHER): Payer: Self-pay | Admitting: Family Medicine

## 2021-02-26 ENCOUNTER — Other Ambulatory Visit: Payer: Self-pay

## 2021-02-26 VITALS — BP 128/75 | HR 53 | Temp 98.0°F | Ht <= 58 in | Wt 221.0 lb

## 2021-02-26 DIAGNOSIS — E1159 Type 2 diabetes mellitus with other circulatory complications: Secondary | ICD-10-CM

## 2021-02-26 DIAGNOSIS — E559 Vitamin D deficiency, unspecified: Secondary | ICD-10-CM

## 2021-02-26 DIAGNOSIS — I152 Hypertension secondary to endocrine disorders: Secondary | ICD-10-CM | POA: Diagnosis not present

## 2021-02-26 DIAGNOSIS — K219 Gastro-esophageal reflux disease without esophagitis: Secondary | ICD-10-CM | POA: Diagnosis not present

## 2021-02-26 DIAGNOSIS — Z9189 Other specified personal risk factors, not elsewhere classified: Secondary | ICD-10-CM

## 2021-02-26 DIAGNOSIS — E1169 Type 2 diabetes mellitus with other specified complication: Secondary | ICD-10-CM

## 2021-02-26 DIAGNOSIS — E785 Hyperlipidemia, unspecified: Secondary | ICD-10-CM

## 2021-02-26 DIAGNOSIS — Z6841 Body Mass Index (BMI) 40.0 and over, adult: Secondary | ICD-10-CM

## 2021-02-26 MED ORDER — VITAMIN D (ERGOCALCIFEROL) 1.25 MG (50000 UNIT) PO CAPS: 50000.0000 [IU] | ORAL_CAPSULE | ORAL | 0 refills | Status: DC

## 2021-03-06 DIAGNOSIS — E1169 Type 2 diabetes mellitus with other specified complication: Secondary | ICD-10-CM | POA: Diagnosis not present

## 2021-03-06 DIAGNOSIS — K219 Gastro-esophageal reflux disease without esophagitis: Secondary | ICD-10-CM | POA: Diagnosis not present

## 2021-03-06 DIAGNOSIS — J45909 Unspecified asthma, uncomplicated: Secondary | ICD-10-CM | POA: Diagnosis not present

## 2021-03-06 DIAGNOSIS — G4700 Insomnia, unspecified: Secondary | ICD-10-CM | POA: Diagnosis not present

## 2021-03-06 DIAGNOSIS — E7800 Pure hypercholesterolemia, unspecified: Secondary | ICD-10-CM | POA: Diagnosis not present

## 2021-03-06 DIAGNOSIS — I10 Essential (primary) hypertension: Secondary | ICD-10-CM | POA: Diagnosis not present

## 2021-03-06 DIAGNOSIS — E559 Vitamin D deficiency, unspecified: Secondary | ICD-10-CM | POA: Diagnosis not present

## 2021-03-11 NOTE — Progress Notes (Signed)
Chief Complaint:   OBESITY Stephanie [name] is here to discuss her progress with her obesity treatment plan along with follow-up of her obesity related diagnoses.   Today's visit was #: 2 Starting weight: 228 lbs Starting date: 02/12/2021 Today's weight: 221 lbs Today's date: 02/26/2021 Weight change since last visit: 7 lbs Total lbs lost to date: 7 lbs Body mass index is 46.19 kg/m.  Total weight loss percentage to date: -3.07%  Interim History:  Stephanie [name] is here today for her first follow-up office visit since starting the program with [name].  All blood work/ lab tests that were recently ordered by myself or an outside provider were reviewed with patient today per their request.   Extended time was spent counseling her on all new disease processes that were discovered or preexisting ones that are worsening.  she understands that many of these abnormalities will need to monitored regularly along with the current treatment plan of prudent dietary changes, in which we are making each and every office visit, to improve these health parameters.  Stephanie [name] had COVID and was on Paxlovid and could not eat for 3 days.  She says that she even went out to eat and had sirloin steak and vegetables.  Snacks on fruit or 100-calorie cookie packet, [place] bar.  Current Meal Plan: the Category 2 Plan with breakfast options for 86% of the time.  Current Exercise Plan: None.  Assessment/Plan:   Medications Discontinued During This Encounter  Medication Reason   amoxicillin-clavulanate (AUGMENTIN) 875-125 MG tablet Error   pantoprazole (PROTONIX) 20 MG tablet Error   predniSONE (DELTASONE) 10 MG tablet Error   diphenhydrAMINE (BENADRYL) 25 MG tablet Error   famotidine (PEPCID) 20 MG tablet Error   ondansetron (ZOFRAN ODT) 4 MG disintegrating tablet Error   ondansetron (ZOFRAN) 8 MG tablet Error   zolpidem (AMBIEN) 10 MG tablet Error   traMADol (ULTRAM) 50 MG tablet Error   lisinopril (PRINIVIL,ZESTRIL) 40  MG tablet Error   RABEprazole (ACIPHEX) 20 MG tablet Error   Cholecalciferol (VITAMIN D) 50 MCG (2000 UT) CAPS Error   Meds ordered this encounter  Medications   Vitamin D, Ergocalciferol, (DRISDOL) 1.25 MG (50000 UNIT) CAPS capsule    Sig: Take 1 capsule (50,000 Units total) by mouth every 7 (seven) days.    Dispense:  4 capsule    Refill:  0    Ov for RF; 30 d supply    1. Type 2 diabetes mellitus with other specified complication, without long-term current use of insulin (HCC) Diabetes Mellitus: At goal. Medication: metformin 500 mg twice daily, Actos 30 mg nightly. Issues reviewed: blood sugar goals, complications of diabetes mellitus, hypoglycemia prevention and treatment, exercise, and nutrition.  FBS 120-125.  No lows.  Highest 155.  A1c 6.5.  No sweets cravings.  Plan:  Discussed labs with patient today.  At goal.  Continue medications for now.  Will consider changing to GLP-1 in the future as needed, especially if she starts to struggle with weight loss. The importance of regular follow up with PCP and all other specialists as scheduled was stressed to patient today. The patient will continue to focus on protein-rich, low simple carbohydrate foods. We reviewed the importance of hydration, regular exercise for stress reduction, and restorative sleep.   Lab Results  Component Value Date   HGBA1C 6.5 12/06/2020   HGBA1C 4.9 07/19/2018   Lab Results  Component Value Date   MICROALBUR 1.92 12/06/2020   LDLCALC 85 02/12/2021  CREATININE 0.78 02/12/2021   2. Hypertension associated with diabetes (HCC) At goal. Medications: atenolol 25 mg daily, HCTZ 12.5 mg daily.    Plan:  Discussed labs with patient today.  Blood pressure at goal.  Continue current medications.  Consider ARB in the future since she has diabetes.  Avoid buying foods that are: processed, frozen, or prepackaged to avoid excess salt. We will watch for signs of hypotension as she continues lifestyle modifications. We  will continue to monitor closely alongside her PCP and/or Specialist.  Regular follow up with PCP and specialists was also encouraged.   BP Readings from Last 3 Encounters:  02/26/21 128/75  02/12/21 135/75  07/14/19 (!) 159/78   Lab Results  Component Value Date   CREATININE 0.78 02/12/2021   3. Hyperlipidemia associated with type 2 diabetes mellitus (HCC) Course: Not at goal. Lipid-lowering medications: Crestor 5 mg daily.  Tolerating well, without issues.  Plan:  Discussed labs with patient today.  LDL not quite at goal of less than 70, but will work on prudent nutritional plan and eventually exercise and recheck in 4 months.  If no improvement, may consider increasing dose of Crestor.  Dietary changes: Increase soluble fiber, decrease simple carbohydrates, decrease saturated fat. Exercise changes: Moderate to vigorous-intensity aerobic activity 150 minutes per week or as tolerated. We will continue to monitor along with PCP/specialists as it pertains to her weight loss journey.  Lab Results  Component Value Date   CHOL 203 (H) 02/12/2021   HDL 95 02/12/2021   LDLCALC 85 02/12/2021   TRIG 135 02/12/2021   Lab Results  Component Value Date   ALT 26 02/12/2021   AST 26 02/12/2021   ALKPHOS 56 02/12/2021   BILITOT 0.3 02/12/2021   The 10-year ASCVD risk score Denman George DC Jr., et al., 2013) is: 12.4%   Values used to calculate the score:     Age: 70 years     Sex: Female     Is Non-Hispanic African American: No     Diabetic: Yes     Tobacco smoker: No     Systolic Blood Pressure: 128 mmHg     Is BP treated: No     HDL Cholesterol: 95 mg/dL     Total Cholesterol: 203 mg/dL  4. Gastroesophageal reflux disease, unspecified whether esophagitis present Reflux symptoms have been very well controlled with meal plan.  No symptoms or concerns at all.  Taking omeprazole 40 mg daily.  We reviewed the diagnosis of GERD and importance of treatment. We discussed "red flag" symptoms and the  importance of follow up if symptoms persisted despite treatment. We reviewed non-pharmacologic management of GERD symptoms: including: caffeine reduction, dietary changes, elevate HOB, NPO after supper, reduction of alcohol intake, tobacco cessation, and weight loss.  5. Vitamin D deficiency Not at goal.  She is taking vitamin D 1,000 IU weekly.  Started around 2 months ago by PCP.  Plan:  Discussed labs with patient today.  Start to take prescription Vitamin D @50 ,000 IU every week as prescribed.  Follow-up for routine testing of Vitamin D, at least 2-3 times per year to avoid over-replacement.  Lab Results  Component Value Date   VD25OH 39.4 12/06/2020   - Refill Vitamin D, Ergocalciferol, (DRISDOL) 1.25 MG (50000 UNIT) CAPS capsule; Take 1 capsule (50,000 Units total) by mouth every 7 (seven) days.  Dispense: 4 capsule; Refill: 0  6. Obesity BMI today 46  Course: Stephanie [name] is currently in the action stage of change. As  such, her goal is to continue with weight loss efforts.   Nutrition goals: She has agreed to the Category 2 Plan with breakfast options.   Exercise goals:  As is.  Behavioral modification strategies: increasing lean protein intake, decreasing simple carbohydrates, meal planning and cooking strategies, and planning for success.  Stephanie [name] has agreed to follow-up with our clinic in 2-3 weeks. She was informed of the importance of frequent follow-up visits to maximize her success with intensive lifestyle modifications for her multiple health conditions.   Objective:   Blood pressure 128/75, pulse (!) 53, temperature 98 F (36.7 C), height 4\' 10"  (1.473 m), weight 221 lb (100.2 kg), SpO2 97 %. Body mass index is 46.19 kg/m.  General: Cooperative, alert, well developed, in no acute distress. HEENT: Conjunctivae and lids unremarkable. Cardiovascular: Regular rhythm.  Lungs: Normal work of breathing. Neurologic: No focal deficits.   Lab Results  Component Value Date    CREATININE 0.78 02/12/2021   BUN 17 02/12/2021   NA 141 02/12/2021   K 4.0 02/12/2021   CL 98 02/12/2021   CO2 26 02/12/2021   Lab Results  Component Value Date   ALT 26 02/12/2021   AST 26 02/12/2021   ALKPHOS 56 02/12/2021   BILITOT 0.3 02/12/2021   Lab Results  Component Value Date   HGBA1C 6.5 12/06/2020   HGBA1C 4.9 07/19/2018   Lab Results  Component Value Date   INSULIN 15.6 02/12/2021   Lab Results  Component Value Date   TSH 1.850 02/12/2021   Lab Results  Component Value Date   CHOL 203 (H) 02/12/2021   HDL 95 02/12/2021   LDLCALC 85 02/12/2021   TRIG 135 02/12/2021   Lab Results  Component Value Date   VD25OH 39.4 12/06/2020   Lab Results  Component Value Date   WBC 5.3 02/12/2021   HGB 12.4 02/12/2021   HCT 37.3 02/12/2021   MCV 92 02/12/2021   PLT 256 02/12/2021   Obesity Behavioral Intervention:   Approximately 15 minutes were spent on the discussion below.  ASK: We discussed the diagnosis of obesity with 02/14/2021 today and Stephanie [name] agreed to give Stephanie [name] permission to discuss obesity behavioral modification therapy today.  ASSESS: Stephanie [name] has the diagnosis of obesity and her BMI today is 46.3. Stephanie [name] is in the action stage of change.   ADVISE: Stephanie [name] was educated on the multiple health risks of obesity as well as the benefit of weight loss to improve her health. She was advised of the need for long term treatment and the importance of lifestyle modifications to improve her current health and to decrease her risk of future health problems.  AGREE: Multiple dietary modification options and treatment options were discussed and Stephanie [name] agreed to follow the recommendations documented in the above note.  ARRANGE: Stephanie [name] was educated on the importance of frequent visits to treat obesity as outlined per CMS and USPSTF guidelines and agreed to schedule her next follow up appointment today.  Attestation Statements:   Reviewed by clinician on day of visit:  allergies, medications, problem list, medical history, surgical history, family history, social history, and previous encounter notes.  I, Stephanie [name], CMA, am acting as Insurance claims handler for Stephanie manager, Stephanie [name].  I have reviewed the above documentation for accuracy and completeness, and I agree with the above. [employer], D.O.  The 21st Century Cures Act was signed into law in 2016 which includes the topic of electronic health records.  This provides immediate access to information  in [place].  This includes consultation notes, operative notes, office notes, lab results and pathology reports.  If you have any questions about what you read please let us know at your next visit so we can discuss your concerns and take corrective action if need be.  We are right here with you.

## 2021-03-13 ENCOUNTER — Ambulatory Visit (INDEPENDENT_AMBULATORY_CARE_PROVIDER_SITE_OTHER): Payer: Medicare Other | Admitting: Family Medicine

## 2021-03-13 ENCOUNTER — Other Ambulatory Visit: Payer: Self-pay

## 2021-03-13 ENCOUNTER — Encounter (INDEPENDENT_AMBULATORY_CARE_PROVIDER_SITE_OTHER): Payer: Self-pay | Admitting: Family Medicine

## 2021-03-13 VITALS — BP 140/67 | HR 52 | Temp 98.0°F | Ht <= 58 in | Wt 219.0 lb

## 2021-03-13 DIAGNOSIS — Z6841 Body Mass Index (BMI) 40.0 and over, adult: Secondary | ICD-10-CM | POA: Diagnosis not present

## 2021-03-13 DIAGNOSIS — E559 Vitamin D deficiency, unspecified: Secondary | ICD-10-CM

## 2021-03-13 DIAGNOSIS — E1169 Type 2 diabetes mellitus with other specified complication: Secondary | ICD-10-CM | POA: Diagnosis not present

## 2021-03-13 MED ORDER — VITAMIN D (ERGOCALCIFEROL) 1.25 MG (50000 UNIT) PO CAPS: 50000.0000 [IU] | ORAL_CAPSULE | ORAL | 0 refills | Status: DC

## 2021-03-18 ENCOUNTER — Telehealth (INDEPENDENT_AMBULATORY_CARE_PROVIDER_SITE_OTHER): Payer: Self-pay

## 2021-03-18 NOTE — Telephone Encounter (Signed)
Dr.Opalski ?

## 2021-03-18 NOTE — Telephone Encounter (Signed)
Contacted pt and advised her of medication refill policy and that we would not expect to see any adverse effects or events from being overdue for Vitamin D at the time of her next visit (03/27/21). Pt verbalized understanding of this, and did not voice any additional comments, questions, or concerns.

## 2021-03-18 NOTE — Telephone Encounter (Signed)
Patient is requesting a refill of her Vit D.  She states she took her last one this morning.  Patient's next appt is Wednesday, 8/10.   Pharmacy:  [facility] in [place]

## 2021-03-20 NOTE — Progress Notes (Signed)
Chief Complaint:   OBESITY Stephanie [name] is here to discuss her progress with her obesity treatment plan along with follow-up of her obesity related diagnoses. Stephanie [name] is on the Category 2 Plan with breakfast options and states she is following her eating plan approximately 95% of the time. Stephanie [name] states she is doing 0 minutes 0 times per week.  Today's visit was #: 3 Starting weight: 228 lbs Starting date: 02/12/2021 Today's weight: 219 lbs Today's date: 03/13/2021 Total lbs lost to date: 9 Total lbs lost since last in-office visit: 2  Interim History: Stephanie [name] is not getting her proteins in. She weighs it but she cannot eat it all. She denies hunger or cravings. She has no issues with the plan.  Subjective:   1. Type 2 diabetes mellitus with other specified complication, without long-term current use of insulin (HCC) Stephanie [name]'s fasting BGs was the lowest this morning at 111, and it has been for 2 years now. Last A1c was 6.5 two and a half years ago. She is checking her BGs twice weekly now and she is happy.  2. Vitamin D deficiency Stephanie [name] is currently taking prescription vitamin D 50,000 IU each week. She denies nausea, vomiting or muscle weakness.  Assessment/Plan:  No orders of the defined types were placed in this encounter.   Medications Discontinued During This Encounter  Medication Reason   Vitamin D, Ergocalciferol, (DRISDOL) 1.25 MG (50000 UNIT) CAPS capsule Reorder     Meds ordered this encounter  Medications   Vitamin D, Ergocalciferol, (DRISDOL) 1.25 MG (50000 UNIT) CAPS capsule    Sig: Take 1 capsule (50,000 Units total) by mouth every 7 (seven) days.    Dispense:  4 capsule    Refill:  0    Ov for RF; 30 d supply     1. Type 2 diabetes mellitus with other specified complication, without long-term current use of insulin (HCC) Good blood sugar control is important to decrease the likelihood of diabetic complications such as nephropathy, neuropathy, limb loss, blindness,  coronary artery disease, and death. Intensive lifestyle modification including diet, exercise and weight loss are the first line of treatment for diabetes.   2. Vitamin D deficiency Low Vitamin D level contributes to fatigue and are associated with obesity, breast, and colon cancer. She agrees to continue to take prescription Vitamin D @50 ,000 IU every week and will follow-up for routine testing of Vitamin D, at least 2-3 times per year to avoid over-replacement.  - Vitamin D, Ergocalciferol, (DRISDOL) 1.25 MG (50000 UNIT) CAPS capsule; Take 1 capsule (50,000 Units total) by mouth every 7 (seven) days.  Dispense: 4 capsule; Refill: 0  3. Obesity with current BMI of 45.8 Stephanie [name] is currently in the action stage of change. As such, her goal is to continue with weight loss efforts. She has agreed to the Category 2 Plan with breakfast options.   Exercise goals: As is.  Behavioral modification strategies: increasing lean protein intake, no skipping meals, avoiding temptations, and planning for success.  Stephanie [name] has agreed to follow-up with our clinic in 2 to 3 weeks. She was informed of the importance of frequent follow-up visits to maximize her success with intensive lifestyle modifications for her multiple health conditions.   Objective:   Blood pressure 140/67, pulse (!) 52, temperature 98 F (36.7 C), height 4\' 10"  (1.473 m), weight 219 lb (99.3 kg), SpO2 95 %. Body mass index is 45.77 kg/m.  General: Cooperative, alert, well developed, in no acute distress. HEENT: Conjunctivae and  lids unremarkable. Cardiovascular: Regular rhythm.  Lungs: Normal work of breathing. Neurologic: No focal deficits.   Lab Results  Component Value Date   CREATININE 0.78 02/12/2021   BUN 17 02/12/2021   NA 141 02/12/2021   K 4.0 02/12/2021   CL 98 02/12/2021   CO2 26 02/12/2021   Lab Results  Component Value Date   ALT 26 02/12/2021   AST 26 02/12/2021   ALKPHOS 56 02/12/2021   BILITOT 0.3 02/12/2021    Lab Results  Component Value Date   HGBA1C 6.5 12/06/2020   HGBA1C 4.9 07/19/2018   Lab Results  Component Value Date   INSULIN 15.6 02/12/2021   Lab Results  Component Value Date   TSH 1.850 02/12/2021   Lab Results  Component Value Date   CHOL 203 (H) 02/12/2021   HDL 95 02/12/2021   LDLCALC 85 02/12/2021   TRIG 135 02/12/2021   Lab Results  Component Value Date   VD25OH 39.4 12/06/2020   Lab Results  Component Value Date   WBC 5.3 02/12/2021   HGB 12.4 02/12/2021   HCT 37.3 02/12/2021   MCV 92 02/12/2021   PLT 256 02/12/2021   No results found for: IRON, TIBC, FERRITIN  Obesity Behavioral Intervention:   Approximately 15 minutes were spent on the discussion below.  ASK: We discussed the diagnosis of obesity with Stephanie [name] today and Stephanie [name] agreed to give [name] permission to discuss obesity behavioral modification therapy today.  ASSESS: Karmela has the diagnosis of obesity and her BMI today is 45.78. Stephanie [name] is in the action stage of change.   ADVISE: Stephanie [name] was educated on the multiple health risks of obesity as well as the benefit of weight loss to improve her health. She was advised of the need for long term treatment and the importance of lifestyle modifications to improve her current health and to decrease her risk of future health problems.  AGREE: Multiple dietary modification options and treatment options were discussed and Stephanie [name] agreed to follow the recommendations documented in the above note.  ARRANGE: Stephanie [name] was educated on the importance of frequent visits to treat obesity as outlined per CMS and USPSTF guidelines and agreed to schedule her next follow up appointment today.  Attestation Statements:   Reviewed by clinician on day of visit: allergies, medications, problem list, medical history, surgical history, family history, social history, and previous encounter notes.   Stephanie [name], am acting as transcriptionist for [employer], DO.  I have  reviewed the above documentation for accuracy and completeness, and I agree with the above. Carlye Grippe, D.O.  The 21st Century Cures Act was signed into law in 2016 which includes the topic of electronic health records.  This provides immediate access to information in MyChart.  This includes consultation notes, operative notes, office notes, lab results and pathology reports.  If you have any questions about what you read please let us know at your next visit so we can discuss your concerns and take corrective action if need be.  We are right here with you.

## 2021-03-27 ENCOUNTER — Ambulatory Visit (INDEPENDENT_AMBULATORY_CARE_PROVIDER_SITE_OTHER): Payer: Medicare Other | Admitting: Family Medicine

## 2021-03-27 ENCOUNTER — Other Ambulatory Visit: Payer: Self-pay

## 2021-03-27 ENCOUNTER — Encounter (INDEPENDENT_AMBULATORY_CARE_PROVIDER_SITE_OTHER): Payer: Self-pay | Admitting: Family Medicine

## 2021-03-27 VITALS — BP 127/75 | HR 54 | Temp 97.8°F | Ht <= 58 in | Wt 214.0 lb

## 2021-03-27 DIAGNOSIS — E559 Vitamin D deficiency, unspecified: Secondary | ICD-10-CM | POA: Diagnosis not present

## 2021-03-27 DIAGNOSIS — E1169 Type 2 diabetes mellitus with other specified complication: Secondary | ICD-10-CM | POA: Diagnosis not present

## 2021-03-27 DIAGNOSIS — Z6841 Body Mass Index (BMI) 40.0 and over, adult: Secondary | ICD-10-CM

## 2021-03-27 MED ORDER — VITAMIN D (ERGOCALCIFEROL) 1.25 MG (50000 UNIT) PO CAPS: 50000.0000 [IU] | ORAL_CAPSULE | ORAL | 0 refills | Status: DC

## 2021-04-01 NOTE — Progress Notes (Signed)
Chief Complaint:   OBESITY Maddyson is here to discuss her progress with her obesity treatment plan along with follow-up of her obesity related diagnoses. Jalaiya is on the Category 2 Plan with breakfast daily and states she is following her eating plan approximately 96% of the time. Noga states she is doing 0 minutes 0 times per week.  Today's visit was #: 4 Starting weight: 228 lbs Starting date: 02/12/2021 Today's weight: 214 lbs Today's date: 03/27/2021 Total lbs lost to date: 14 Total lbs lost since last in-office visit: 5  Interim History: Pennye notes difficulty to eat all of her protein. She is unable to eat dinner 6-8 oz of protein. She favors starchy veggies and I have reminded her to limit those to 1 cup only at dinner.  Subjective:   1. Type 2 diabetes mellitus with other specified complication, without long-term current use of insulin (HCC) Garielle's low BGs are 107 post prandial, and she felt shaky and not well.  2. Vitamin D deficiency Xandria is currently taking prescription vitamin D 50,000 IU each week. She denies nausea, vomiting or muscle weakness.  Assessment/Plan:  No orders of the defined types were placed in this encounter.   Medications Discontinued During This Encounter  Medication Reason   Vitamin D, Ergocalciferol, (DRISDOL) 1.25 MG (50000 UNIT) CAPS capsule Reorder     Meds ordered this encounter  Medications   DISCONTD: Vitamin D, Ergocalciferol, (DRISDOL) 1.25 MG (50000 UNIT) CAPS capsule    Sig: Take 1 capsule (50,000 Units total) by mouth every 7 (seven) days.    Dispense:  4 capsule    Refill:  0    Ov for RF; 30 d supply     1. Type 2 diabetes mellitus with other specified complication, without long-term current use of insulin (HCC) Leandria will continue metformin, and she is to decrease Actos dose by 1/2, down to 15 mg, and will continue close monitoring. Hypoglycemia prevention plan was given to the patient. Good blood sugar control is  important to decrease the likelihood of diabetic complications such as nephropathy, neuropathy, limb loss, blindness, coronary artery disease, and death. Intensive lifestyle modification including diet, exercise and weight loss are the first line of treatment for diabetes.   2. Vitamin D deficiency Low Vitamin D level contributes to fatigue and are associated with obesity, breast, and colon cancer. We will refill prescription Vitamin D for 1 month. Maddix will follow-up for routine testing of Vitamin D, at least 2-3 times per year to avoid over-replacement.  - Vitamin D, Ergocalciferol, (DRISDOL) 1.25 MG (50000 UNIT) CAPS capsule; Take 1 capsule (50,000 Units total) by mouth every 7 (seven) days.  Dispense: 4 capsule; Refill: 0  3. Obesity with current BMI 44.9 Haruka is currently in the action stage of change. As such, her goal is to continue with weight loss efforts. She has agreed to the Category 2 Plan with breakfast options.   Exercise goals: As is.  Behavioral modification strategies: increasing lean protein intake, decreasing simple carbohydrates, and increasing water intake.  Mirah has agreed to follow-up with our clinic in 3 weeks. She was informed of the importance of frequent follow-up visits to maximize her success with intensive lifestyle modifications for her multiple health conditions.   Objective:   Blood pressure 127/75, pulse (!) 54, temperature 97.8 F (36.6 C), height 4\' 10"  (1.473 m), weight 214 lb (97.1 kg), SpO2 96 %. Body mass index is 44.73 kg/m.  General: Cooperative, alert, well developed, in no  acute distress. HEENT: Conjunctivae and lids unremarkable. Cardiovascular: Regular rhythm.  Lungs: Normal work of breathing. Neurologic: No focal deficits.   Lab Results  Component Value Date   CREATININE 0.78 02/12/2021   BUN 17 02/12/2021   NA 141 02/12/2021   K 4.0 02/12/2021   CL 98 02/12/2021   CO2 26 02/12/2021   Lab Results  Component Value Date   ALT 26  02/12/2021   AST 26 02/12/2021   ALKPHOS 56 02/12/2021   BILITOT 0.3 02/12/2021   Lab Results  Component Value Date   HGBA1C 6.5 12/06/2020   HGBA1C 4.9 07/19/2018   Lab Results  Component Value Date   INSULIN 15.6 02/12/2021   Lab Results  Component Value Date   TSH 1.850 02/12/2021   Lab Results  Component Value Date   CHOL 203 (H) 02/12/2021   HDL 95 02/12/2021   LDLCALC 85 02/12/2021   TRIG 135 02/12/2021   Lab Results  Component Value Date   VD25OH 39.4 12/06/2020   Lab Results  Component Value Date   WBC 5.3 02/12/2021   HGB 12.4 02/12/2021   HCT 37.3 02/12/2021   MCV 92 02/12/2021   PLT 256 02/12/2021   No results found for: IRON, TIBC, FERRITIN  Obesity Behavioral Intervention:   Approximately 15 minutes were spent on the discussion below.  ASK: We discussed the diagnosis of obesity with [name] today and Bonnie agreed to give [name] permission to discuss obesity behavioral modification therapy today.  ASSESS: Braxton has the diagnosis of obesity and her BMI today is 44.74. Armelia is in the action stage of change.   ADVISE: Annalissa was educated on the multiple health risks of obesity as well as the benefit of weight loss to improve her health. She was advised of the need for long term treatment and the importance of lifestyle modifications to improve her current health and to decrease her risk of future health problems.  AGREE: Multiple dietary modification options and treatment options were discussed and Alison agreed to follow the recommendations documented in the above note.  ARRANGE: Sebastiana was educated on the importance of frequent visits to treat obesity as outlined per CMS and USPSTF guidelines and agreed to schedule her next follow up appointment today.  Attestation Statements:   Reviewed by clinician on day of visit: allergies, medications, problem list, medical history, surgical history, family history, social history, and previous encounter  notes.   [name], am acting as transcriptionist for [employer], DO.  I have reviewed the above documentation for accuracy and completeness, and I agree with the above. Carlye Grippe, D.O.  The 21st Century Cures Act was signed into law in 2016 which includes the topic of electronic health records.  This provides immediate access to information in MyChart.  This includes consultation notes, operative notes, office notes, lab results and pathology reports.  If you have any questions about what you read please let us know at your next visit so we can discuss your concerns and take corrective action if need be.  We are right here with you.

## 2021-04-10 ENCOUNTER — Encounter (INDEPENDENT_AMBULATORY_CARE_PROVIDER_SITE_OTHER): Payer: Self-pay | Admitting: Family Medicine

## 2021-04-10 ENCOUNTER — Ambulatory Visit (INDEPENDENT_AMBULATORY_CARE_PROVIDER_SITE_OTHER): Payer: Medicare Other | Admitting: Family Medicine

## 2021-04-10 ENCOUNTER — Other Ambulatory Visit: Payer: Self-pay

## 2021-04-10 VITALS — BP 131/72 | HR 52 | Temp 98.3°F | Ht <= 58 in | Wt 212.0 lb

## 2021-04-10 DIAGNOSIS — E559 Vitamin D deficiency, unspecified: Secondary | ICD-10-CM

## 2021-04-10 DIAGNOSIS — Z6841 Body Mass Index (BMI) 40.0 and over, adult: Secondary | ICD-10-CM

## 2021-04-10 DIAGNOSIS — E1169 Type 2 diabetes mellitus with other specified complication: Secondary | ICD-10-CM

## 2021-04-10 MED ORDER — VITAMIN D (ERGOCALCIFEROL) 1.25 MG (50000 UNIT) PO CAPS: 50000.0000 [IU] | ORAL_CAPSULE | ORAL | 0 refills | Status: DC

## 2021-04-11 NOTE — Progress Notes (Signed)
Chief Complaint:   OBESITY Stephanie [name] is here to discuss her progress with her obesity treatment plan along with [name] of her obesity related diagnoses. Stephanie [name] is on the Category 2 Plan with breakfast options and states she is following her eating plan approximately 66% of the time. Stephanie [name] states she is doing 0 minutes 0 times per week.  Today's visit was #: 5 Starting weight: 228 lbs Starting date: 02/12/2021 Today's weight: 212 lbs Today's date: 04/10/2021 Total lbs lost to date: 16 Total lbs lost since last in-office visit: 2  Interim History: Stephanie [name]'s daughter's marriage is struggling and she is taking extra time caring and helping her.  Subjective:   1. Type 2 diabetes mellitus with other specified complication, without long-term current use of insulin (HCC) Stephanie [name]'s highest fasting BGs was 135 and average is 110. She denies lows. She is taking metformin and Actos.  2. Vitamin [name] deficiency Stephanie [name]'s last Vit [name] level was 39.4 on 12/06/2020 Stephanie hr PCP's office. She has been on Vit [name] prescription since early July, and she is tolerating it well with no issues, and her compliance is good.  Assessment/Plan:   Orders Placed This Encounter  Procedures   Hemoglobin A1c   VITAMIN [name] 25 Hydroxy (Vit-[name] Deficiency, Fractures)    Medications Discontinued During This Encounter  Medication Reason   Vitamin [name], Ergocalciferol, (DRISDOL) 1.25 MG (50000 UNIT) CAPS capsule Reorder     Meds ordered this encounter  Medications   DISCONTD: Vitamin [name], Ergocalciferol, (DRISDOL) 1.25 MG (50000 UNIT) CAPS capsule    Sig: Take 1 capsule (50,000 Units total) by mouth every 7 (seven) days.    Dispense:  4 capsule    Refill:  0    Ov for RF; 30 [name] supply     1. Type 2 diabetes mellitus with other specified complication, without long-term current use of insulin (HCC) We will recheck labs Stephanie [facility] next office visit. We will consider changing her medications Stephanie that time. Good blood sugar control is  important to decrease the likelihood of diabetic complications such as nephropathy, neuropathy, limb loss, blindness, coronary artery disease, and death. Intensive lifestyle modification including diet, exercise and weight loss are the first line of treatment for diabetes.   - Hemoglobin A1c  2. Vitamin [name] deficiency Low Vitamin [name] level contributes to fatigue and are associated with obesity, breast, and colon cancer. We will recheck labs Stephanie her next office visit. We will refill prescription Vitamin [name] for 1 month. Stephanie [name], Stephanie [name].  - Vitamin [name], Ergocalciferol, (DRISDOL) 1.25 MG (50000 UNIT) CAPS capsule; Take 1 capsule (50,000 Units total) by mouth every 7 (seven) days.  Dispense: 4 capsule; Refill: 0 - VITAMIN [name] 25 Hydroxy (Vit-[name] Deficiency, Fractures)  3. Obesity with current BMI of 40.2 Stephanie [name]. As such, her goal is to continue with weight loss efforts. She has agreed to the Category 2 Plan.   We will recheck fasting labs Stephanie her next office visit.  Exercise goals: As is.  Behavioral modification strategies: increasing lean protein intake, decreasing simple carbohydrates, and no skipping meals.  Stephanie [name]. She was informed of the importance of frequent [name] visits to maximize her success with intensive lifestyle modifications for her multiple health conditions.   Objective:   Blood pressure 131/72, pulse (!) 52,  temperature 98.3 F (36.8 C), height 4\' 10"  (1.473 m), weight 212 lb (96.2 kg), SpO2 96 %. Body mass index is 44.31 kg/m.  General: Cooperative, alert, well developed, in no acute distress. HEENT: Conjunctivae and lids unremarkable. Cardiovascular: Regular rhythm.  Lungs: Normal work of breathing. Neurologic: No focal deficits.   Lab Results  Component Value Date   CREATININE 0.78  02/12/2021   BUN 17 02/12/2021   NA 141 02/12/2021   K 4.0 02/12/2021   CL 98 02/12/2021   CO2 26 02/12/2021   Lab Results  Component Value Date   ALT 26 02/12/2021   AST 26 02/12/2021   ALKPHOS 56 02/12/2021   BILITOT 0.3 02/12/2021   Lab Results  Component Value Date   HGBA1C 6.5 12/06/2020   HGBA1C 4.9 07/19/2018   Lab Results  Component Value Date   INSULIN 15.6 02/12/2021   Lab Results  Component Value Date   TSH 1.850 02/12/2021   Lab Results  Component Value Date   CHOL 203 (H) 02/12/2021   HDL 95 02/12/2021   LDLCALC 85 02/12/2021   TRIG 135 02/12/2021   Lab Results  Component Value Date   VD25OH 39.4 12/06/2020   Lab Results  Component Value Date   WBC 5.3 02/12/2021   HGB 12.4 02/12/2021   HCT 37.3 02/12/2021   MCV 92 02/12/2021   PLT 256 02/12/2021   No results found for: IRON, TIBC, FERRITIN  Obesity Behavioral Intervention:   Approximately 15 minutes were spent on the discussion below.  ASK: We discussed the diagnosis of obesity with 02/14/2021 today and Nykiah agreed to give [name] permission to discuss obesity behavioral modification therapy today.  ASSESS: Elanore has the diagnosis of obesity and her BMI today is 40.2. Sophiamarie is in the action stage of [name].   ADVISE: Katrina was educated on the multiple health risks of obesity as well as the benefit of weight loss to improve her health. She was advised of the need for long term treatment and the importance of lifestyle modifications to improve her current health and to decrease her risk of future health problems.  AGREE: Multiple dietary modification options and treatment options were discussed and Carlyn agreed to follow the recommendations documented in the above note.  ARRANGE: Cheyenna was educated on the importance of frequent visits to treat obesity as outlined per CMS and USPSTF guidelines and agreed to schedule her next follow up appointment today.  Attestation Statements:   Reviewed by  clinician on day of visit: allergies, medications, problem list, medical history, surgical history, family history, social history, and previous encounter notes.   [name], am acting as transcriptionist for [name], DO.  I have reviewed the above documentation for accuracy and completeness, and I agree with the above. [employer], [name].O.  The 21st Century Cures Act was signed into law in 2016 which includes the topic of electronic health records.  This provides immediate access to information in MyChart.  This includes consultation notes, operative notes, office notes, lab results and pathology reports.  If you have any questions about what you read please let 2017 know Stephanie your next visit so we can discuss your concerns and take corrective action if need be.  We are right here with you.

## 2021-04-18 ENCOUNTER — Other Ambulatory Visit (INDEPENDENT_AMBULATORY_CARE_PROVIDER_SITE_OTHER): Payer: Self-pay | Admitting: Family Medicine

## 2021-04-18 DIAGNOSIS — E559 Vitamin D deficiency, unspecified: Secondary | ICD-10-CM

## 2021-04-24 ENCOUNTER — Encounter (INDEPENDENT_AMBULATORY_CARE_PROVIDER_SITE_OTHER): Payer: Self-pay | Admitting: Family Medicine

## 2021-04-24 ENCOUNTER — Ambulatory Visit (INDEPENDENT_AMBULATORY_CARE_PROVIDER_SITE_OTHER): Payer: Medicare Other | Admitting: Family Medicine

## 2021-04-24 ENCOUNTER — Other Ambulatory Visit: Payer: Self-pay

## 2021-04-24 VITALS — BP 133/79 | HR 48 | Temp 97.5°F | Ht <= 58 in | Wt 205.0 lb

## 2021-04-24 DIAGNOSIS — Z6841 Body Mass Index (BMI) 40.0 and over, adult: Secondary | ICD-10-CM

## 2021-04-24 DIAGNOSIS — E1169 Type 2 diabetes mellitus with other specified complication: Secondary | ICD-10-CM | POA: Diagnosis not present

## 2021-04-24 DIAGNOSIS — E559 Vitamin D deficiency, unspecified: Secondary | ICD-10-CM

## 2021-04-24 MED ORDER — VITAMIN D (ERGOCALCIFEROL) 1.25 MG (50000 UNIT) PO CAPS: 50000.0000 [IU] | ORAL_CAPSULE | ORAL | 0 refills | Status: DC

## 2021-04-25 LAB — HEMOGLOBIN A1C
Est. average glucose Bld gHb Est-mCnc: 111 mg/dL
Hgb A1c MFr Bld: 5.5 % (ref 4.8–5.6)

## 2021-04-25 LAB — VITAMIN D 25 HYDROXY (VIT D DEFICIENCY, FRACTURES): Vit D, 25-Hydroxy: 68.6 ng/mL (ref 30.0–100.0)

## 2021-04-25 NOTE — Progress Notes (Signed)
Chief Complaint:   OBESITY Stephanie [name] is here to discuss her progress with her obesity treatment plan along with follow-up of her obesity related diagnoses. Stephanie [name] is on the Category 2 Plan and states she is following her eating plan approximately 95% of the time. Stephanie [name] states she is doing 0 minutes 0 times per week.  Today's visit was #: 6 Starting weight: 228 lbs Starting date: 02/12/2021 Today's weight: 205 lbs Today's date: 04/24/2021 Total lbs lost to date: 23 Total lbs lost since last in-office visit: 7  Interim History: Stephanie [name] notes the past 2 weeks she followed her meal plan more than before. She is doing less snacking and having less cravings because she has been busy with life. She notes her hunger and cravings are controlled. She is not skipping meals, and she is measuring her protein and veggies.  Subjective:   1. Type 2 diabetes mellitus with other specified complication, without long-term current use of insulin (HCC) Stephanie [name]'s BGs range between 90's and 130. Last A1c 4 months ago was 6.5. She is taking metformin and Actos.  2. Vitamin D deficiency Stephanie [name] is tolerating medication(s) well without side effects.  Medication compliance is good and patient appears to be taking it as prescribed.  Denies additional concerns regarding this condition.   Assessment/Plan:   Orders Placed This Encounter  Procedures   Hemoglobin A1c   VITAMIN D 25 Hydroxy (Vit-D Deficiency, Fractures)    Medications Discontinued During This Encounter  Medication Reason   Vitamin D, Ergocalciferol, (DRISDOL) 1.25 MG (50000 UNIT) CAPS capsule Reorder     Meds ordered this encounter  Medications   Vitamin D, Ergocalciferol, (DRISDOL) 1.25 MG (50000 UNIT) CAPS capsule    Sig: Take 1 capsule (50,000 Units total) by mouth every 7 (seven) days.    Dispense:  4 capsule    Refill:  0    Ov for RF; 30 d supply     1. Type 2 diabetes mellitus with other specified complication, without long-term  current use of insulin (HCC) We will check A1c today. We will consider changing from Actos to another medication at her next office visit, after review of labs. Stephanie [name] will continue her prudent nutritional plan.  - Counseled patient on pathophysiology of disease and discussed good blood sugar control is important to decrease the likelihood of diabetic complications such as nephropathy, neuropathy, limb loss, blindness, coronary artery disease, and death.  Intensive lifestyle modification including diet, exercise and weight loss are the first line of treatment for diabetes.    - Continue home blood sugar monitoring regularly - closely- especially with continued wt loss.  Reviewed blood sugar goals.  Reminded if pt feels poorly- check BS and BP at that time.  Bring in BS and BP logs each OV. - Eat on a regular basis- no skipping or going long periods without eating.  We discussed hypoglycemia prevention. - Any concerns about medicines should be directed at the prescribing provider - Recheck labs in 3 months if not done at Endo/ PCP.  - Importance of f/up with PCP and all other specialists as scheduled was stressed to pt today - Pt should contact their endocrinologist or PCP as they see fit with any Q's/ concerns with what we discuss.   - It is recommended for pt to continue with current plan and medication doses at this time  ( please adjust verbage if I change treatment- thnx!)   2. Vitamin D deficiency We will check Vit D  level today, and we will refill prescription Vit D for 1 month.  - Discussed importance of vitamin D to their health and well-being.  - possible symptoms of low Vitamin D can be low energy, depressed mood, muscle aches, joint aches, osteoporosis etc. - low Vitamin D levels may be linked to an increased risk of cardiovascular events and even increased risk of cancers- such as colon and breast.  - I recommend pt take a 50,000 IU weekly prescription Vit D - see script below   -  Informed patient this may be a lifelong thing, and she was encouraged to continue to take the medicine until told otherwise.   - we will need to monitor levels regularly (every 3-4 mo on average) to keep levels within normal limits.  - weight loss will likely improve availability of vitamin D, thus encouraged Stephanie [name] to continue with meal plan and their weight loss efforts to further improve this condition - pt's questions and concerns regarding this condition addressed.  - Vitamin D, Ergocalciferol, (DRISDOL) 1.25 MG (50000 UNIT) CAPS capsule; Take 1 capsule (50,000 Units total) by mouth every 7 (seven) days.  Dispense: 4 capsule; Refill: 0  3. Obesity with current BMI of 43.0 Stephanie [name] is currently in the action stage of change. As such, her goal is to continue with weight loss efforts. She has agreed to the Category 2 Plan.   Stephanie [name] was given handouts on recipes and healthy ways to cook food she loves.  Exercise goals: As is.  Behavioral modification strategies: meal planning and cooking strategies and avoiding temptations.  Stephanie [name] has agreed to follow-up with our clinic in 2 to 3 weeks. She was informed of the importance of frequent follow-up visits to maximize her success with intensive lifestyle modifications for her multiple health conditions.   Objective:   Blood pressure 133/79, pulse (!) 48, temperature (!) 97.5 F (36.4 C), height 4\' 10"  (1.473 m), weight 205 lb (93 kg), SpO2 97 %. Body mass index is 42.85 kg/m.  General: Cooperative, alert, well developed, in no acute distress. HEENT: Conjunctivae and lids unremarkable. Cardiovascular: Regular rhythm.  Lungs: Normal work of breathing. Neurologic: No focal deficits.   Lab Results  Component Value Date   CREATININE 0.78 02/12/2021   BUN 17 02/12/2021   NA 141 02/12/2021   K 4.0 02/12/2021   CL 98 02/12/2021   CO2 26 02/12/2021   Lab Results  Component Value Date   ALT 26 02/12/2021   AST 26 02/12/2021   ALKPHOS 56  02/12/2021   BILITOT 0.3 02/12/2021   Lab Results  Component Value Date   HGBA1C 6.5 12/06/2020   HGBA1C 4.9 07/19/2018   Lab Results  Component Value Date   INSULIN 15.6 02/12/2021   Lab Results  Component Value Date   TSH 1.850 02/12/2021   Lab Results  Component Value Date   CHOL 203 (H) 02/12/2021   HDL 95 02/12/2021   LDLCALC 85 02/12/2021   TRIG 135 02/12/2021   Lab Results  Component Value Date   VD25OH 39.4 12/06/2020   Lab Results  Component Value Date   WBC 5.3 02/12/2021   HGB 12.4 02/12/2021   HCT 37.3 02/12/2021   MCV 92 02/12/2021   PLT 256 02/12/2021   No results found for: IRON, TIBC, FERRITIN  Obesity Behavioral Intervention:   Approximately 15 minutes were spent on the discussion below.  ASK: We discussed the diagnosis of obesity with 02/14/2021 today and Stephanie [name] agreed to give Stephanie [name] permission to  discuss obesity behavioral modification therapy today.  ASSESS: Stephanie [name] has the diagnosis of obesity and her BMI today is 43.0. Stephanie [name] is in the action stage of change.   ADVISE: Stephanie [name] was educated on the multiple health risks of obesity as well as the benefit of weight loss to improve her health. She was advised of the need for long term treatment and the importance of lifestyle modifications to improve her current health and to decrease her risk of future health problems.  AGREE: Multiple dietary modification options and treatment options were discussed and Stephanie [name] agreed to follow the recommendations documented in the above note.  ARRANGE: Stephanie [name] was educated on the importance of frequent visits to treat obesity as outlined per CMS and USPSTF guidelines and agreed to schedule her next follow up appointment today.  Attestation Statements:   Reviewed by clinician on day of visit: allergies, medications, problem list, medical history, surgical history, family history, social history, and previous encounter notes.   [name], am acting as  transcriptionist for [employer], DO.  I have reviewed the above documentation for accuracy and completeness, and I agree with the above. Carlye Grippe, D.O.  The 21st Century Cures Act was signed into law in 2016 which includes the topic of electronic health records.  This provides immediate access to information in MyChart.  This includes consultation notes, operative notes, office notes, lab results and pathology reports.  If you have any questions about what you read please let us know at your next visit so we can discuss your concerns and take corrective action if need be.  We are right here with you.

## 2021-05-08 ENCOUNTER — Other Ambulatory Visit: Payer: Self-pay

## 2021-05-08 ENCOUNTER — Ambulatory Visit (INDEPENDENT_AMBULATORY_CARE_PROVIDER_SITE_OTHER): Payer: Medicare Other | Admitting: Family Medicine

## 2021-05-08 ENCOUNTER — Encounter (INDEPENDENT_AMBULATORY_CARE_PROVIDER_SITE_OTHER): Payer: Self-pay | Admitting: Family Medicine

## 2021-05-08 VITALS — BP 138/79 | HR 48 | Temp 97.8°F | Ht <= 58 in | Wt 205.0 lb

## 2021-05-08 DIAGNOSIS — E1169 Type 2 diabetes mellitus with other specified complication: Secondary | ICD-10-CM | POA: Diagnosis not present

## 2021-05-08 DIAGNOSIS — Z6841 Body Mass Index (BMI) 40.0 and over, adult: Secondary | ICD-10-CM

## 2021-05-08 DIAGNOSIS — E559 Vitamin D deficiency, unspecified: Secondary | ICD-10-CM

## 2021-05-08 MED ORDER — VITAMIN D (ERGOCALCIFEROL) 1.25 MG (50000 UNIT) PO CAPS: 50000.0000 [IU] | ORAL_CAPSULE | ORAL | 0 refills | Status: DC

## 2021-05-08 MED ORDER — OZEMPIC (0.25 OR 0.5 MG/DOSE) 2 MG/1.5ML ~~LOC~~ SOPN: 0.5000 mg | PEN_INJECTOR | SUBCUTANEOUS | 0 refills | Status: DC

## 2021-05-08 NOTE — Progress Notes (Signed)
Chief Complaint:   OBESITY Stephanie [name] is here to discuss her progress with her obesity treatment plan along with [name] of her obesity related diagnoses. Stephanie [name] is on the Category 2 Plan and states she is following her eating plan approximately 60% of the time. Stephanie [name] states she is not currently exercising.  Today's visit was #: 7 Starting weight: 228 lbs Starting date: 02/12/2021 Today's weight: 205 lbs Today's date: 05/08/2021 Total lbs lost to date: 23 Total lbs lost since last in-office visit: 0  Interim History: Stephanie [name] was on vacation the past couple of weeks.  Stephanie [name] is here for a follow up office visit.  We reviewed her meal plan and questions were answered.  Patient's food recall appears to be accurate and consistent with what is on plan when she is following it.   When eating on plan, her hunger and cravings are well controlled. Most of the time, pt doesn't eat enough at skips meals.  Subjective:   1. Type 2 diabetes mellitus with other specified complication, without long-term current use of insulin (HCC) Discussed labs with patient today. A1c much improved! Stephanie [name] has not had Actos in a week. Fasting blood sugars run around 113 and 2 hours post prandial 120's. She reports some hunger and carb cravings.  2. Vitamin D deficiency Discussed labs with patient today. At goal. She is currently taking prescription vitamin D 50,000 IU each week. She denies nausea, vomiting or muscle weakness.  Lab Results  Component Value Date   VD25OH 68.6 04/24/2021   VD25OH 39.4 12/06/2020   Assessment/Plan:  No orders of the defined types were placed in this encounter.   Medications Discontinued During This Encounter  Medication Reason   pioglitazone (ACTOS) 30 MG tablet    Vitamin D, Ergocalciferol, (DRISDOL) 1.25 MG (50000 UNIT) CAPS capsule Reorder     Meds ordered this encounter  Medications   Vitamin D, Ergocalciferol, (DRISDOL) 1.25 MG (50000 UNIT) CAPS capsule    Sig:  Take 1 capsule (50,000 Units total) by mouth every 7 (seven) days.    Dispense:  4 capsule    Refill:  0    Ov for RF; 30 d supply   Semaglutide,0.25 or 0.5MG /DOS, (OZEMPIC, 0.25 OR 0.5 MG/DOSE,) 2 MG/1.5ML SOPN    Sig: Inject 0.5 mg into the skin once a week.    Dispense:  1.5 mL    Refill:  0    30 d supply;  ** OV for RF **   Do not send RF request     1. Type 2 diabetes mellitus with other specified complication, without long-term current use of insulin (HCC) Good blood sugar control is important to decrease the likelihood of diabetic complications such as nephropathy, neuropathy, limb loss, blindness, coronary artery disease, and death. Intensive lifestyle modification including diet, exercise and weight loss are the first line of treatment for diabetes.  Discontinue Actos!! Start Ozempic 0.5 mg weekly, as prescribed. Continue Metformin as directed.  Start- Semaglutide,0.25 or 0.5MG /DOS, (OZEMPIC, 0.25 OR 0.5 MG/DOSE,) 2 MG/1.5ML SOPN; Inject 0.5 mg into the skin once a week.  Dispense: 1.5 mL; Refill: 0  2. Vitamin D deficiency Low Vitamin D level contributes to fatigue and are associated with obesity, breast, and colon cancer. She agrees to continue to take prescription Vitamin D 50,000 IU every week and will [name] for routine testing of Vitamin D, at least 2-3 times per year to avoid over-replacement. Level was at goal at the end of summer. Recheck  labs in 3-4 months.  Refill- Vitamin D, Ergocalciferol, (DRISDOL) 1.25 MG (50000 UNIT) CAPS capsule; Take 1 capsule (50,000 Units total) by mouth every 7 (seven) days.  Dispense: 4 capsule; Refill: 0  3. Obesity with current BMI of 42.9  Stephanie [name] is currently in the action stage of change. As such, her goal is to continue with weight loss efforts. She has agreed to the Category 2 Plan.   Exercise goals:  Walk 5-10 minutes a day.  Behavioral modification strategies: increasing lean protein intake, decreasing simple carbohydrates, and  no skipping meals.  Stephanie [name] has agreed to [name] with our clinic in 3 weeks. She was informed of the importance of frequent [name] visits to maximize her success with intensive lifestyle modifications for her multiple health conditions.   Objective:   Blood pressure 138/79, pulse (!) 48, temperature 97.8 F (36.6 C), height 4\' 10"  (1.473 m), weight 205 lb (93 kg), SpO2 95 %. Body mass index is 42.85 kg/m.  General: Cooperative, alert, well developed, in no acute distress. HEENT: Conjunctivae and lids unremarkable. Cardiovascular: Regular rhythm.  Lungs: Normal work of breathing. Neurologic: No focal deficits.   Lab Results  Component Value Date   CREATININE 0.78 02/12/2021   BUN 17 02/12/2021   NA 141 02/12/2021   K 4.0 02/12/2021   CL 98 02/12/2021   CO2 26 02/12/2021   Lab Results  Component Value Date   ALT 26 02/12/2021   AST 26 02/12/2021   ALKPHOS 56 02/12/2021   BILITOT 0.3 02/12/2021   Lab Results  Component Value Date   HGBA1C 5.5 04/24/2021   HGBA1C 6.5 12/06/2020   HGBA1C 4.9 07/19/2018   Lab Results  Component Value Date   INSULIN 15.6 02/12/2021   Lab Results  Component Value Date   TSH 1.850 02/12/2021   Lab Results  Component Value Date   CHOL 203 (H) 02/12/2021   HDL 95 02/12/2021   LDLCALC 85 02/12/2021   TRIG 135 02/12/2021   Lab Results  Component Value Date   VD25OH 68.6 04/24/2021   VD25OH 39.4 12/06/2020   Lab Results  Component Value Date   WBC 5.3 02/12/2021   HGB 12.4 02/12/2021   HCT 37.3 02/12/2021   MCV 92 02/12/2021   PLT 256 02/12/2021   No results found for: IRON, TIBC, FERRITIN  Obesity Behavioral Intervention:   Approximately 15 minutes were spent on the discussion below.  ASK: We discussed the diagnosis of obesity with 02/14/2021 today and Stephanie [name] agreed to give Stephanie [name] permission to discuss obesity behavioral modification therapy today.  ASSESS: Stephanie [name] has the diagnosis of obesity and her BMI today is 42.9. Stephanie [name]  is in the action stage of change.   ADVISE: Stephanie [name] was educated on the multiple health risks of obesity as well as the benefit of weight loss to improve her health. She was advised of the need for long term treatment and the importance of lifestyle modifications to improve her current health and to decrease her risk of future health problems.  AGREE: Multiple dietary modification options and treatment options were discussed and Stephanie [name] agreed to follow the recommendations documented in the above note.  ARRANGE: Stephanie [name] was educated on the importance of frequent visits to treat obesity as outlined per CMS and USPSTF guidelines and agreed to schedule her next follow up appointment today.  Attestation Statements:   Reviewed by clinician on day of visit: allergies, medications, problem list, medical history, surgical history, family history, social history, and previous encounter notes.  Stephanie [name], CMA, am acting as transcriptionist for [employer], DO.  I have reviewed the above documentation for accuracy and completeness, and I agree with the above. Carlye Grippe, D.O.  The 21st Century Cures Act was signed into law in 2016 which includes the topic of electronic health records.  This provides immediate access to information in MyChart.  This includes consultation notes, operative notes, office notes, lab results and pathology reports.  If you have any questions about what you read please let us know at your next visit so we can discuss your concerns and take corrective action if need be.  We are right here with you.

## 2021-05-09 ENCOUNTER — Encounter (INDEPENDENT_AMBULATORY_CARE_PROVIDER_SITE_OTHER): Payer: Self-pay | Admitting: Family Medicine

## 2021-05-22 ENCOUNTER — Encounter (INDEPENDENT_AMBULATORY_CARE_PROVIDER_SITE_OTHER): Payer: Self-pay | Admitting: Family Medicine

## 2021-05-22 ENCOUNTER — Ambulatory Visit (INDEPENDENT_AMBULATORY_CARE_PROVIDER_SITE_OTHER): Payer: Medicare Other | Admitting: Family Medicine

## 2021-05-22 ENCOUNTER — Other Ambulatory Visit: Payer: Self-pay

## 2021-05-22 VITALS — BP 154/80 | HR 62 | Temp 97.4°F | Ht <= 58 in | Wt 201.0 lb

## 2021-05-22 DIAGNOSIS — E1169 Type 2 diabetes mellitus with other specified complication: Secondary | ICD-10-CM | POA: Diagnosis not present

## 2021-05-22 DIAGNOSIS — Z6841 Body Mass Index (BMI) 40.0 and over, adult: Secondary | ICD-10-CM | POA: Diagnosis not present

## 2021-05-22 MED ORDER — OZEMPIC (0.25 OR 0.5 MG/DOSE) 2 MG/1.5ML ~~LOC~~ SOPN: 0.2500 mg | PEN_INJECTOR | SUBCUTANEOUS | 0 refills | Status: DC

## 2021-05-22 NOTE — Progress Notes (Signed)
Chief Complaint:   OBESITY Stephanie [name] is here to discuss her progress with her obesity treatment plan along with follow-up of her obesity related diagnoses. Stephanie [name] is on the Category 2 Plan and states she is following her eating plan approximately 80% of the time. Stephanie [name] states she is not currently exercising.  Today's visit was #: 8 Starting weight: 228 lbs Starting date: 02/12/2021 Today's weight: 201 lbs Today's date: 05/22/2021 Total lbs lost to date: 27 Total lbs lost since last in-office visit: 4  Interim History: Stephanie [name] is not tolerating Ozempic well. She feels nauseous and unable to eat, however, she started Ozempic at 0.5 mg instead of 0.25 mg weekly as we discussed in the office. She is eating on plan 90% of the time. She snacks on yasso or fruit.   Subjective:   1. Type 2 diabetes mellitus with other specified complication, without long-term current use of insulin (HCC) Stephanie [name]'s fasting blood sugars have improved this past week on Ozempic. Her highest BS was 124. Prior to Ozempic, she was 160-170.   Assessment/Plan:  No orders of the defined types were placed in this encounter.   Medications Discontinued During This Encounter  Medication Reason   Semaglutide,0.25 or 0.5MG /DOS, (OZEMPIC, 0.25 OR 0.5 MG/DOSE,) 2 MG/1.5ML SOPN Reorder     Meds ordered this encounter  Medications   Semaglutide,0.25 or 0.5MG /DOS, (OZEMPIC, 0.25 OR 0.5 MG/DOSE,) 2 MG/1.5ML SOPN    Sig: Inject 0.25 mg into the skin once a week.    Dispense:  1.5 mL    Refill:  0    30 d supply;  ** OV for RF **   Do not send RF request     1. Type 2 diabetes mellitus with other specified complication, without long-term current use of insulin (HCC) Good blood sugar control is important to decrease the likelihood of diabetic complications such as nephropathy, neuropathy, limb loss, blindness, coronary artery disease, and death. Intensive lifestyle modification including diet, exercise and weight loss are the  first line of treatment for diabetes.  Decrease Ozempic to 0.25 mg weekly, decrease simple carbohydrates, and don't skip meals. Continue to monitor blood sugars at home and contact the office with concerns.  Refill- Semaglutide,0.25 or 0.5MG /DOS, (OZEMPIC, 0.25 OR 0.5 MG/DOSE,) 2 MG/1.5ML SOPN; Inject 0.25 mg into the skin once a week.  Dispense: 1.5 mL; Refill: 0  2. Obesity with current BMI of 42.2  Stephanie [name] is currently in the action stage of change. As such, her goal is to continue with weight loss efforts. She has agreed to the Category 2 Plan.   Exercise goals:  Start 5-10 minutes of walking per day.  Behavioral modification strategies: decreasing simple carbohydrates, no skipping meals, and planning for success.  Stephanie [name] has agreed to follow-up with our clinic in 3 weeks. She was informed of the importance of frequent follow-up visits to maximize her success with intensive lifestyle modifications for her multiple health conditions.   Objective:   Blood pressure (!) 154/80, pulse 62, temperature (!) 97.4 F (36.3 C), height 4\' 10"  (1.473 m), weight 201 lb (91.2 kg), SpO2 95 %. Body mass index is 42.01 kg/m.  General: Cooperative, alert, well developed, in no acute distress. HEENT: Conjunctivae and lids unremarkable. Cardiovascular: Regular rhythm.  Lungs: Normal work of breathing. Neurologic: No focal deficits.   Lab Results  Component Value Date   CREATININE 0.78 02/12/2021   BUN 17 02/12/2021   NA 141 02/12/2021   K 4.0 02/12/2021   CL 98  02/12/2021   CO2 26 02/12/2021   Lab Results  Component Value Date   ALT 26 02/12/2021   AST 26 02/12/2021   ALKPHOS 56 02/12/2021   BILITOT 0.3 02/12/2021   Lab Results  Component Value Date   HGBA1C 5.5 04/24/2021   HGBA1C 6.5 12/06/2020   HGBA1C 4.9 07/19/2018   Lab Results  Component Value Date   INSULIN 15.6 02/12/2021   Lab Results  Component Value Date   TSH 1.850 02/12/2021   Lab Results  Component Value Date    CHOL 203 (H) 02/12/2021   HDL 95 02/12/2021   LDLCALC 85 02/12/2021   TRIG 135 02/12/2021   Lab Results  Component Value Date   VD25OH 68.6 04/24/2021   VD25OH 39.4 12/06/2020   Lab Results  Component Value Date   WBC 5.3 02/12/2021   HGB 12.4 02/12/2021   HCT 37.3 02/12/2021   MCV 92 02/12/2021   PLT 256 02/12/2021   No results found for: IRON, TIBC, FERRITIN  Obesity Behavioral Intervention:   Approximately 15 minutes were spent on the discussion below.  ASK: We discussed the diagnosis of obesity with Stephanie [name] today and Stephanie [name] agreed to give [name] permission to discuss obesity behavioral modification therapy today.  ASSESS: Stephanie [name] has the diagnosis of obesity and her BMI today is 42.2. Stephanie [name] is in the action stage of change.   ADVISE: Stephanie [name] was educated on the multiple health risks of obesity as well as the benefit of weight loss to improve her health. She was advised of the need for long term treatment and the importance of lifestyle modifications to improve her current health and to decrease her risk of future health problems.  AGREE: Multiple dietary modification options and treatment options were discussed and Stephanie [name] agreed to follow the recommendations documented in the above note.  ARRANGE: Stephanie [name] was educated on the importance of frequent visits to treat obesity as outlined per CMS and USPSTF guidelines and agreed to schedule her next follow up appointment today.  Attestation Statements:   Reviewed by clinician on day of visit: allergies, medications, problem list, medical history, surgical history, family history, social history, and previous encounter notes.  Edmund Hilda, CMA, am acting as transcriptionist for [employer], DO.  I have reviewed the above documentation for accuracy and completeness, and I agree with the above. Carlye Grippe, D.O.  The 21st Century Cures Act was signed into law in 2016 which includes the topic of electronic health  records.  This provides immediate access to information in MyChart.  This includes consultation notes, operative notes, office notes, lab results and pathology reports.  If you have any questions about what you read please let us know at your next visit so we can discuss your concerns and take corrective action if need be.  We are right here with you.

## 2021-06-12 ENCOUNTER — Ambulatory Visit (INDEPENDENT_AMBULATORY_CARE_PROVIDER_SITE_OTHER): Payer: Medicare Other | Admitting: Family Medicine

## 2021-06-12 ENCOUNTER — Encounter (INDEPENDENT_AMBULATORY_CARE_PROVIDER_SITE_OTHER): Payer: Self-pay | Admitting: Family Medicine

## 2021-06-12 ENCOUNTER — Other Ambulatory Visit: Payer: Self-pay

## 2021-06-12 VITALS — BP 129/84 | HR 55 | Temp 98.2°F | Ht <= 58 in | Wt 197.0 lb

## 2021-06-12 DIAGNOSIS — Z9189 Other specified personal risk factors, not elsewhere classified: Secondary | ICD-10-CM

## 2021-06-12 DIAGNOSIS — Z6841 Body Mass Index (BMI) 40.0 and over, adult: Secondary | ICD-10-CM

## 2021-06-12 DIAGNOSIS — E1169 Type 2 diabetes mellitus with other specified complication: Secondary | ICD-10-CM | POA: Diagnosis not present

## 2021-06-12 MED ORDER — OZEMPIC (0.25 OR 0.5 MG/DOSE) 2 MG/1.5ML ~~LOC~~ SOPN: 0.2500 mg | PEN_INJECTOR | SUBCUTANEOUS | 0 refills | Status: DC

## 2021-06-12 NOTE — Progress Notes (Signed)
Chief Complaint:   OBESITY Stephanie [name] is here to discuss her progress with her obesity treatment plan along with follow-up of her obesity related diagnoses. Stephanie [name] is on the Category 2 Plan and states she is following her eating plan approximately 80% of the time. Stephanie [name] states she is walking 5 minutes 7 times per week.  Today's visit was #: 9 Starting weight: 228 lbs Starting date: 02/12/2021 Today's weight: 197 lbs Today's date: 06/12/2021 Total lbs lost to date: 31 Total lbs lost since last in-office visit: 4  Interim History: Stephanie [name] hasn't been able to eat all foods on plan due to mild nausea secondary to Ozempic, but she feels it is getting better and denies need for treatment change.  Subjective:   1. Type 2 diabetes mellitus with other specified complication, without long-term current use of insulin (HCC) Stephanie [name]'s fasting blood sugars run in the 90's. She has no lows or highs. Stephanie [name] reports some nausea with Ozempic but it has improved and Stephanie [name] declines to stop medication. She likes how it has controlled her blood sugars.  2. At risk for deficient intake of food The patient is at a higher than average risk of deficient intake of food due to poor intake. Assessment/Plan:  No orders of the defined types were placed in this encounter.   Medications Discontinued During This Encounter  Medication Reason   Semaglutide,0.25 or 0.5MG /DOS, (OZEMPIC, 0.25 OR 0.5 MG/DOSE,) 2 MG/1.5ML SOPN Reorder     Meds ordered this encounter  Medications   Semaglutide,0.25 or 0.5MG /DOS, (OZEMPIC, 0.25 OR 0.5 MG/DOSE,) 2 MG/1.5ML SOPN    Sig: Inject 0.25 mg into the skin once a week.    Dispense:  1.5 mL    Refill:  0    30 d supply;  ** OV for RF **   Do not send RF request     1. Type 2 diabetes mellitus with other specified complication, without long-term current use of insulin (HCC) Continue Ozempic as directed. Stephanie [name] advised that she can try 1/2 clicks to 0.25 mg. Eat multiple smaller meals per day  to help get all foods in.  Refill- Semaglutide,0.25 or 0.5MG /DOS, (OZEMPIC, 0.25 OR 0.5 MG/DOSE,) 2 MG/1.5ML SOPN; Inject 0.25 mg into the skin once a week.  Dispense: 1.5 mL; Refill: 0  2. At risk for deficient intake of food Stephanie [name] was given approximately 9 minutes of deficit intake of food prevention counseling today. Stephanie [name] is at risk for eating too few calories based on current food recall. She was encouraged to focus on meeting caloric and protein goals according to her recommended meal plan.   3. Obesity with current BMI of 41.2  Stephanie [name] is currently in the action stage of change. As such, her goal is to continue with weight loss efforts. She has agreed to the Category 2 Plan.   Exercise goals:  Increase walking to 10 minutes a day 7 days a week.  Behavioral modification strategies: increasing lean protein intake, no skipping meals, and planning for success.  Stephanie [name] has agreed to follow-up with our clinic in 2-3 weeks. She was informed of the importance of frequent follow-up visits to maximize her success with intensive lifestyle modifications for her multiple health conditions.   Objective:   Blood pressure 129/84, pulse (!) 55, temperature 98.2 F (36.8 C), height 4\' 10"  (1.473 m), weight 197 lb (89.4 kg), SpO2 96 %. Body mass index is 41.17 kg/m.  General: Cooperative, alert, well developed, in no acute distress. HEENT: Conjunctivae and lids  unremarkable. Cardiovascular: Regular rhythm.  Lungs: Normal work of breathing. Neurologic: No focal deficits.   Lab Results  Component Value Date   CREATININE 0.78 02/12/2021   BUN 17 02/12/2021   NA 141 02/12/2021   K 4.0 02/12/2021   CL 98 02/12/2021   CO2 26 02/12/2021   Lab Results  Component Value Date   ALT 26 02/12/2021   AST 26 02/12/2021   ALKPHOS 56 02/12/2021   BILITOT 0.3 02/12/2021   Lab Results  Component Value Date   HGBA1C 5.5 04/24/2021   HGBA1C 6.5 12/06/2020   HGBA1C 4.9 07/19/2018   Lab Results   Component Value Date   INSULIN 15.6 02/12/2021   Lab Results  Component Value Date   TSH 1.850 02/12/2021   Lab Results  Component Value Date   CHOL 203 (H) 02/12/2021   HDL 95 02/12/2021   LDLCALC 85 02/12/2021   TRIG 135 02/12/2021   Lab Results  Component Value Date   VD25OH 68.6 04/24/2021   VD25OH 39.4 12/06/2020   Lab Results  Component Value Date   WBC 5.3 02/12/2021   HGB 12.4 02/12/2021   HCT 37.3 02/12/2021   MCV 92 02/12/2021   PLT 256 02/12/2021    Attestation Statements:   Reviewed by clinician on day of visit: allergies, medications, problem list, medical history, surgical history, family history, social history, and previous encounter notes.  Edmund Hilda, CMA, am acting as transcriptionist for [employer], DO.  I have reviewed the above documentation for accuracy and completeness, and I agree with the above. Carlye Grippe, D.O.  The 21st Century Cures Act was signed into law in 2016 which includes the topic of electronic health records.  This provides immediate access to information in MyChart.  This includes consultation notes, operative notes, office notes, lab results and pathology reports.  If you have any questions about what you read please let us know at your next visit so we can discuss your concerns and take corrective action if need be.  We are right here with you.

## 2021-06-21 DIAGNOSIS — R21 Rash and other nonspecific skin eruption: Secondary | ICD-10-CM | POA: Diagnosis not present

## 2021-06-21 DIAGNOSIS — Z23 Encounter for immunization: Secondary | ICD-10-CM | POA: Diagnosis not present

## 2021-06-26 ENCOUNTER — Other Ambulatory Visit: Payer: Self-pay

## 2021-06-26 ENCOUNTER — Encounter (INDEPENDENT_AMBULATORY_CARE_PROVIDER_SITE_OTHER): Payer: Self-pay | Admitting: Family Medicine

## 2021-06-26 ENCOUNTER — Ambulatory Visit (INDEPENDENT_AMBULATORY_CARE_PROVIDER_SITE_OTHER): Payer: Medicare Other | Admitting: Family Medicine

## 2021-06-26 VITALS — BP 147/78 | HR 56 | Temp 97.6°F | Ht <= 58 in | Wt 196.0 lb

## 2021-06-26 DIAGNOSIS — E559 Vitamin D deficiency, unspecified: Secondary | ICD-10-CM | POA: Diagnosis not present

## 2021-06-26 DIAGNOSIS — Z6841 Body Mass Index (BMI) 40.0 and over, adult: Secondary | ICD-10-CM | POA: Diagnosis not present

## 2021-06-26 DIAGNOSIS — E1169 Type 2 diabetes mellitus with other specified complication: Secondary | ICD-10-CM | POA: Diagnosis not present

## 2021-06-26 DIAGNOSIS — E66813 Obesity, class 3: Secondary | ICD-10-CM

## 2021-06-26 MED ORDER — VITAMIN D (ERGOCALCIFEROL) 1.25 MG (50000 UNIT) PO CAPS: 50000.0000 [IU] | ORAL_CAPSULE | ORAL | 0 refills | Status: DC

## 2021-06-27 NOTE — Progress Notes (Signed)
Chief Complaint:   OBESITY Stephanie [name] is here to discuss her progress with her obesity treatment plan along with follow-up of her obesity related diagnoses. Stephanie [name] is on the Category 2 Plan and states she is following her eating plan approximately 50% of the time. Stephanie [name] states she is walking for 10 minutes 5 times per week.  Today's visit was #: 10 Starting weight: 228 lbs Starting date: 02/12/2021 Today's weight: 196 lbs Today's date: 06/26/2021 Total lbs lost to date: 32 Total lbs lost since last in-office visit: 1  Interim History: Stephanie [name] is not happy with only 1 lb weight loss. She started walking more regularly 5 days per week. She occasionally skips food (protein).  Subjective:   1. Type 2 diabetes mellitus with other specified complication, without long-term current use of insulin (HCC) Stephanie [name] is taking Ozempic 0.25 mg or less, and she is no longer with nausea when she goes half way to 0.25 mg. Her blood sugars range in the 110's, no higher than 115. She denies low at all.  2. Vitamin D deficiency Stephanie [name] is currently taking prescription vitamin D 50,000 IU each week. She denies nausea, vomiting or muscle weakness.  Assessment/Plan:  No orders of the defined types were placed in this encounter.   Medications Discontinued During This Encounter  Medication Reason   Vitamin D, Ergocalciferol, (DRISDOL) 1.25 MG (50000 UNIT) CAPS capsule Reorder     Meds ordered this encounter  Medications   Vitamin D, Ergocalciferol, (DRISDOL) 1.25 MG (50000 UNIT) CAPS capsule    Sig: Take 1 capsule (50,000 Units total) by mouth every 7 (seven) days.    Dispense:  4 capsule    Refill:  0    Ov for RF; 30 d supply     1. Type 2 diabetes mellitus with other specified complication, without long-term current use of insulin (HCC) [name] will continue Ozempic, with no change in dose. She will continue her prudent nutritional plan and weight loss efforts. Good blood sugar control is important to  decrease the likelihood of diabetic complications such as nephropathy, neuropathy, limb loss, blindness, coronary artery disease, and death. Intensive lifestyle modification including diet, exercise and weight loss are the first line of treatment for diabetes.   2. Vitamin D deficiency Low Vitamin D level contributes to fatigue and are associated with obesity, breast, and colon cancer. We will refill prescription Vitamin D 50,000 IU every week for 1 month. Stephanie [name] will follow-up for routine testing of Vitamin D, at least 2-3 times per year to avoid over-replacement.  - Vitamin D, Ergocalciferol, (DRISDOL) 1.25 MG (50000 UNIT) CAPS capsule; Take 1 capsule (50,000 Units total) by mouth every 7 (seven) days.  Dispense: 4 capsule; Refill: 0  3. Obesity with current BMI of 41.0 Stephanie [name] is currently in the action stage of change. As such, her goal is to continue with weight loss efforts. She has agreed to the Category 2 Plan.   Stephanie [name] is to measure/weigh her protein, and she is not skip meals.  Exercise goals: As is, increase as tolerated.  Behavioral modification strategies: increasing lean protein intake, decreasing simple carbohydrates, no skipping meals, and planning for success.  Stephanie [name] has agreed to follow-up with our clinic in 2 to 3 weeks. She was informed of the importance of frequent follow-up visits to maximize her success with intensive lifestyle modifications for her multiple health conditions.   Objective:   Blood pressure (!) 147/78, pulse (!) 56, temperature 97.6 F (36.4 C), height 4\' 10"  (1.473  m), weight 196 lb (88.9 kg), SpO2 97 %. Body mass index is 40.96 kg/m.  General: Cooperative, alert, well developed, in no acute distress. HEENT: Conjunctivae and lids unremarkable. Cardiovascular: Regular rhythm.  Lungs: Normal work of breathing. Neurologic: No focal deficits.   Lab Results  Component Value Date   CREATININE 0.78 02/12/2021   BUN 17 02/12/2021   NA 141 02/12/2021   K  4.0 02/12/2021   CL 98 02/12/2021   CO2 26 02/12/2021   Lab Results  Component Value Date   ALT 26 02/12/2021   AST 26 02/12/2021   ALKPHOS 56 02/12/2021   BILITOT 0.3 02/12/2021   Lab Results  Component Value Date   HGBA1C 5.5 04/24/2021   HGBA1C 6.5 12/06/2020   HGBA1C 4.9 07/19/2018   Lab Results  Component Value Date   INSULIN 15.6 02/12/2021   Lab Results  Component Value Date   TSH 1.850 02/12/2021   Lab Results  Component Value Date   CHOL 203 (H) 02/12/2021   HDL 95 02/12/2021   LDLCALC 85 02/12/2021   TRIG 135 02/12/2021   Lab Results  Component Value Date   VD25OH 68.6 04/24/2021   VD25OH 39.4 12/06/2020   Lab Results  Component Value Date   WBC 5.3 02/12/2021   HGB 12.4 02/12/2021   HCT 37.3 02/12/2021   MCV 92 02/12/2021   PLT 256 02/12/2021   No results found for: IRON, TIBC, FERRITIN  Obesity Behavioral Intervention:   Approximately 15 minutes were spent on the discussion below.  ASK: We discussed the diagnosis of obesity with [name] today and Khloey agreed to give [name] permission to discuss obesity behavioral modification therapy today.  ASSESS: Stephanie [name] has the diagnosis of obesity and her BMI today is 41.0. Katriel is in the action stage of change.   ADVISE: Treana was educated on the multiple health risks of obesity as well as the benefit of weight loss to improve her health. She was advised of the need for long term treatment and the importance of lifestyle modifications to improve her current health and to decrease her risk of future health problems.  AGREE: Multiple dietary modification options and treatment options were discussed and Stephanie [name] agreed to follow the recommendations documented in the above note.  ARRANGE: Stephanie [name] was educated on the importance of frequent visits to treat obesity as outlined per CMS and USPSTF guidelines and agreed to schedule her next follow up appointment today.  Attestation Statements:   Reviewed by clinician  on day of visit: allergies, medications, problem list, medical history, surgical history, family history, social history, and previous encounter notes.   [name], am acting as transcriptionist for [employer], DO.  I have reviewed the above documentation for accuracy and completeness, and I agree with the above. Carlye Grippe, D.O.  The 21st Century Cures Act was signed into law in 2016 which includes the topic of electronic health records.  This provides immediate access to information in MyChart.  This includes consultation notes, operative notes, office notes, lab results and pathology reports.  If you have any questions about what you read please let us know at your next visit so we can discuss your concerns and take corrective action if need be.  We are right here with you.

## 2021-07-13 ENCOUNTER — Other Ambulatory Visit (INDEPENDENT_AMBULATORY_CARE_PROVIDER_SITE_OTHER): Payer: Self-pay | Admitting: Family Medicine

## 2021-07-13 DIAGNOSIS — E1169 Type 2 diabetes mellitus with other specified complication: Secondary | ICD-10-CM

## 2021-07-17 ENCOUNTER — Encounter (INDEPENDENT_AMBULATORY_CARE_PROVIDER_SITE_OTHER): Payer: Self-pay | Admitting: Family Medicine

## 2021-07-17 ENCOUNTER — Other Ambulatory Visit: Payer: Self-pay

## 2021-07-17 ENCOUNTER — Ambulatory Visit (INDEPENDENT_AMBULATORY_CARE_PROVIDER_SITE_OTHER): Payer: Medicare Other | Admitting: Family Medicine

## 2021-07-17 VITALS — BP 120/77 | HR 59 | Temp 97.8°F | Ht <= 58 in | Wt 193.0 lb

## 2021-07-17 DIAGNOSIS — Z6841 Body Mass Index (BMI) 40.0 and over, adult: Secondary | ICD-10-CM

## 2021-07-17 DIAGNOSIS — E1169 Type 2 diabetes mellitus with other specified complication: Secondary | ICD-10-CM | POA: Diagnosis not present

## 2021-07-17 DIAGNOSIS — E785 Hyperlipidemia, unspecified: Secondary | ICD-10-CM | POA: Diagnosis not present

## 2021-07-17 DIAGNOSIS — E538 Deficiency of other specified B group vitamins: Secondary | ICD-10-CM | POA: Diagnosis not present

## 2021-07-17 DIAGNOSIS — E559 Vitamin D deficiency, unspecified: Secondary | ICD-10-CM

## 2021-07-18 NOTE — Progress Notes (Signed)
Chief Complaint:   OBESITY Stephanie [name] is here to discuss her progress with her obesity treatment plan along with follow-up of her obesity related diagnoses. Stephanie [name] is on the Category 2 Plan and states she is following her eating plan approximately 75% of the time. Stephanie [name] states she is walking 10 minutes 7 times per week.  Today's visit was #: 11 Starting weight: 228 lbs Starting date: 02/12/2021 Today's weight: 193 lbs Today's date: 07/17/2021 Total lbs lost to date: 35 Total lbs lost since last in-office visit: 3  Interim History: Stephanie [name] is here for a follow up office visit.  We reviewed her meal plan and questions were answered.  Patient's food recall appears to be accurate and consistent with what is on plan when she is following it.   When eating on plan, her hunger and cravings are well controlled.   Stephanie [name] forgot to measure her proteins and is not sure she is getting enough proteins. She did start walking for 10 minutes most days.  Subjective:   1. Type 2 diabetes mellitus with other specified complication, without long-term current use of insulin (HCC) Pt's fasting blood sugars= 108, 117. The past 3 weeks, pt taking 12 clicks of Ozempic (just shy of 0.25 mg). She is tolerating it well with no side effects. It is helping with cravings and she denies hunger.  2. Hyperlipidemia associated with type 2 diabetes mellitus (HCC) Stephanie [name] has hyperlipidemia and has been trying to improve her cholesterol levels with intensive lifestyle modification including a low saturated fat diet, exercise and weight loss. She denies any chest pain, claudication or myalgias. Medication: Crestor  3. Vitamin D deficiency She is currently taking prescription vitamin D 50,000 IU each week. She denies nausea, vomiting or muscle weakness.  4. B12 deficiency Stephanie [name] is taking OTC B12 500 mcg QD.  Assessment/Plan:   Orders Placed This Encounter  Procedures   Hemoglobin A1c   Insulin, random    Comprehensive metabolic panel   Lipid Panel With LDL/HDL Ratio   VITAMIN D 25 Hydroxy (Vit-D Deficiency, Fractures)   Vitamin B12    There are no discontinued medications.   No orders of the defined types were placed in this encounter.    1. Type 2 diabetes mellitus with other specified complication, without long-term current use of insulin (HCC) Good blood sugar control is important to decrease the likelihood of diabetic complications such as nephropathy, neuropathy, limb loss, blindness, coronary artery disease, and death. Intensive lifestyle modification including diet, exercise and weight loss are the first line of treatment for diabetes. Check labs at next OV.  - Hemoglobin A1c - Insulin, random - Comprehensive metabolic panel  2. Hyperlipidemia associated with type 2 diabetes mellitus (HCC) Cardiovascular risk and specific lipid/LDL goals reviewed.  We discussed several lifestyle modifications today and Stephanie [name] will continue to work on diet, exercise and weight loss efforts. Orders and follow up as documented in patient record.   Counseling Intensive lifestyle modifications are the first line treatment for this issue. Dietary changes: Increase soluble fiber. Decrease simple carbohydrates. Exercise changes: Moderate to vigorous-intensity aerobic activity 150 minutes per week if tolerated. Lipid-lowering medications: see documented in medical record. Check labs at next OV.  - Lipid Panel With LDL/HDL Ratio  3. Vitamin D deficiency Low Vitamin D level contributes to fatigue and are associated with obesity, breast, and colon cancer. She agrees to continue to take prescription Vitamin D 50,000 IU every week and will follow-up for routine testing of Vitamin  D, at least 2-3 times per year to avoid over-replacement. Check labs at next OV.  - VITAMIN D 25 Hydroxy (Vit-D Deficiency, Fractures)  4. B12 deficiency The diagnosis was reviewed with the patient. Counseling provided today, see  below. We will continue to monitor. Orders and follow up as documented in patient record.  Counseling The body needs vitamin B12: to make red blood cells; to make DNA; and to help the nerves work properly so they can carry messages from the brain to the body.  The main causes of vitamin B12 deficiency include dietary deficiency, digestive diseases, pernicious anemia, and having a surgery in which part of the stomach or small intestine is removed.  Certain medicines can make it harder for the body to absorb vitamin B12. These medicines include: heartburn medications; some antibiotics; some medications used to treat diabetes, gout, and high cholesterol.  In some cases, there are no symptoms of this condition. If the condition leads to anemia or nerve damage, various symptoms can occur, such as weakness or fatigue, shortness of breath, and numbness or tingling in your hands and feet.   Treatment:  May include taking vitamin B12 supplements.  Avoid alcohol.  Eat lots of healthy foods that contain vitamin B12: Beef, pork, chicken, [place], and organ meats, such as liver.  Seafood: This includes clams, rainbow trout, salmon, tuna, and haddock. Eggs.  Cereal and dairy products that are fortified: This means that vitamin B12 has been added to the food.  Check labs at next OV.  - Vitamin B12  5. Obesity with current BMI of 40.4  Stephanie [name] is currently in the action stage of change. As such, her goal is to continue with weight loss efforts. She has agreed to the Category 2 Plan.   Exercise goals:  Increase as tolerated.  Behavioral modification strategies: planning for success.  Stephanie [name] has agreed to follow-up with our clinic in 2 weeks. She was informed of the importance of frequent follow-up visits to maximize her success with intensive lifestyle modifications for her multiple health conditions.   Objective:   Blood pressure 120/77, pulse (!) 59, temperature 97.8 F (36.6 C), height 4\' 10"  (1.473 m),  weight 193 lb (87.5 kg), SpO2 97 %. Body mass index is 40.34 kg/m.  General: Cooperative, alert, well developed, in no acute distress. HEENT: Conjunctivae and lids unremarkable. Cardiovascular: Regular rhythm.  Lungs: Normal work of breathing. Neurologic: No focal deficits.   Lab Results  Component Value Date   CREATININE 0.78 02/12/2021   BUN 17 02/12/2021   NA 141 02/12/2021   K 4.0 02/12/2021   CL 98 02/12/2021   CO2 26 02/12/2021   Lab Results  Component Value Date   ALT 26 02/12/2021   AST 26 02/12/2021   ALKPHOS 56 02/12/2021   BILITOT 0.3 02/12/2021   Lab Results  Component Value Date   HGBA1C 5.5 04/24/2021   HGBA1C 6.5 12/06/2020   HGBA1C 4.9 07/19/2018   Lab Results  Component Value Date   INSULIN 15.6 02/12/2021   Lab Results  Component Value Date   TSH 1.850 02/12/2021   Lab Results  Component Value Date   CHOL 203 (H) 02/12/2021   HDL 95 02/12/2021   LDLCALC 85 02/12/2021   TRIG 135 02/12/2021   Lab Results  Component Value Date   VD25OH 68.6 04/24/2021   VD25OH 39.4 12/06/2020   Lab Results  Component Value Date   WBC 5.3 02/12/2021   HGB 12.4 02/12/2021   HCT 37.3 02/12/2021  MCV 92 02/12/2021   PLT 256 02/12/2021   No results found for: IRON, TIBC, FERRITIN  Obesity Behavioral Intervention:   Approximately 15 minutes were spent on the discussion below.  ASK: We discussed the diagnosis of obesity with Stephanie [name] today and Stephanie [name] agreed to give [name] permission to discuss obesity behavioral modification therapy today.  ASSESS: Stephanie [name] has the diagnosis of obesity and her BMI today is 40.4. Lessly is in the action stage of change.   ADVISE: Stephanie [name] was educated on the multiple health risks of obesity as well as the benefit of weight loss to improve her health. She was advised of the need for long term treatment and the importance of lifestyle modifications to improve her current health and to decrease her risk of future health  problems.  AGREE: Multiple dietary modification options and treatment options were discussed and Stephanie [name] agreed to follow the recommendations documented in the above note.  ARRANGE: Stephanie [name] was educated on the importance of frequent visits to treat obesity as outlined per CMS and USPSTF guidelines and agreed to schedule her next follow up appointment today.  Attestation Statements:   Reviewed by clinician on day of visit: allergies, medications, problem list, medical history, surgical history, family history, social history, and previous encounter notes.  Edmund Hilda, CMA, am acting as transcriptionist for [employer], DO.  I have reviewed the above documentation for accuracy and completeness, and I agree with the above. Carlye Grippe, D.O.  The 21st Century Cures Act was signed into law in 2016 which includes the topic of electronic health records.  This provides immediate access to information in MyChart.  This includes consultation notes, operative notes, office notes, lab results and pathology reports.  If you have any questions about what you read please let us know at your next visit so we can discuss your concerns and take corrective action if need be.  We are right here with you.

## 2021-07-31 DIAGNOSIS — R519 Headache, unspecified: Secondary | ICD-10-CM | POA: Diagnosis not present

## 2021-08-01 ENCOUNTER — Other Ambulatory Visit: Payer: Self-pay

## 2021-08-01 ENCOUNTER — Ambulatory Visit (INDEPENDENT_AMBULATORY_CARE_PROVIDER_SITE_OTHER): Payer: Medicare Other | Admitting: Family Medicine

## 2021-08-01 ENCOUNTER — Encounter (INDEPENDENT_AMBULATORY_CARE_PROVIDER_SITE_OTHER): Payer: Self-pay | Admitting: Family Medicine

## 2021-08-01 VITALS — BP 138/65 | HR 55 | Temp 97.6°F | Ht <= 58 in | Wt 188.0 lb

## 2021-08-01 DIAGNOSIS — E559 Vitamin D deficiency, unspecified: Secondary | ICD-10-CM | POA: Diagnosis not present

## 2021-08-01 DIAGNOSIS — E785 Hyperlipidemia, unspecified: Secondary | ICD-10-CM | POA: Diagnosis not present

## 2021-08-01 DIAGNOSIS — Z6841 Body Mass Index (BMI) 40.0 and over, adult: Secondary | ICD-10-CM

## 2021-08-01 DIAGNOSIS — E1169 Type 2 diabetes mellitus with other specified complication: Secondary | ICD-10-CM | POA: Diagnosis not present

## 2021-08-01 DIAGNOSIS — E538 Deficiency of other specified B group vitamins: Secondary | ICD-10-CM | POA: Diagnosis not present

## 2021-08-01 MED ORDER — OZEMPIC (0.25 OR 0.5 MG/DOSE) 2 MG/1.5ML ~~LOC~~ SOPN: 0.2500 mg | PEN_INJECTOR | SUBCUTANEOUS | 0 refills | Status: DC

## 2021-08-01 MED ORDER — VITAMIN D (ERGOCALCIFEROL) 1.25 MG (50000 UNIT) PO CAPS: 50000.0000 [IU] | ORAL_CAPSULE | ORAL | 0 refills | Status: DC

## 2021-08-02 LAB — COMPREHENSIVE METABOLIC PANEL
ALT: 20 IU/L (ref 0–32)
AST: 20 IU/L (ref 0–40)
Albumin/Globulin Ratio: 1.9 (ref 1.2–2.2)
Albumin: 4.7 g/dL (ref 3.8–4.8)
Alkaline Phosphatase: 53 IU/L (ref 44–121)
BUN/Creatinine Ratio: 26 (ref 12–28)
BUN: 22 mg/dL (ref 8–27)
Bilirubin Total: 0.4 mg/dL (ref 0.0–1.2)
CO2: 26 mmol/L (ref 20–29)
Calcium: 10 mg/dL (ref 8.7–10.3)
Chloride: 98 mmol/L (ref 96–106)
Creatinine, Ser: 0.84 mg/dL (ref 0.57–1.00)
Globulin, Total: 2.5 g/dL (ref 1.5–4.5)
Glucose: 90 mg/dL (ref 70–99)
Potassium: 4.1 mmol/L (ref 3.5–5.2)
Sodium: 143 mmol/L (ref 134–144)
Total Protein: 7.2 g/dL (ref 6.0–8.5)
eGFR: 76 mL/min/{1.73_m2} (ref >59)

## 2021-08-02 LAB — LIPID PANEL WITH LDL/HDL RATIO
Cholesterol, Total: 176 mg/dL (ref 100–199)
HDL: 67 mg/dL (ref >39)
LDL Chol Calc (NIH): 73 mg/dL (ref 0–99)
LDL/HDL Ratio: 1.1 ratio (ref 0.0–3.2)
Triglycerides: 224 mg/dL — ABNORMAL HIGH (ref 0–149)
VLDL Cholesterol Cal: 36 mg/dL (ref 5–40)

## 2021-08-02 LAB — HEMOGLOBIN A1C
Est. average glucose Bld gHb Est-mCnc: 108 mg/dL
Hgb A1c MFr Bld: 5.4 % (ref 4.8–5.6)

## 2021-08-02 LAB — VITAMIN D 25 HYDROXY (VIT D DEFICIENCY, FRACTURES): Vit D, 25-Hydroxy: 72 ng/mL (ref 30.0–100.0)

## 2021-08-02 LAB — INSULIN, RANDOM: INSULIN: 13 u[IU]/mL (ref 2.6–24.9)

## 2021-08-02 LAB — VITAMIN B12: Vitamin B-12: 1246 pg/mL — ABNORMAL HIGH (ref 232–1245)

## 2021-08-05 NOTE — Progress Notes (Signed)
Chief Complaint:   OBESITY Stephanie [name] is here to discuss her progress with her obesity treatment plan along with follow-up of her obesity related diagnoses. Stephanie [name] is on the Category 2 Plan and states she is following her eating plan approximately 80% of the time. Stephanie [name] states she is walking 10 minutes 5 times per week.  Today's visit was #: 12 Starting weight: 228 lbs Starting date: 02/12/2021 Today's weight: 188 lbs Today's date: 08/01/2021 Total lbs lost to date: 40 Total lbs lost since last in-office visit: 5  Interim History: Stephanie [name] recently had a sinus infection and was treated with antibiotics and prednisone, which she starts today. Stephanie [name] is here for a follow up office visit.  We reviewed her meal plan and questions were answered.  Patient's food recall appears to be accurate and consistent with what is on plan when she is following it.   When eating on plan, her hunger and cravings are well controlled.    Subjective:   1. Type 2 diabetes mellitus with other specified complication, without long-term current use of insulin (HCC) Stephanie [name] is doing well with medication and reports it is helping control her appetite and cravings.  2. Vitamin D deficiency She is currently taking prescription vitamin D 50,000 IU each week. She denies nausea, vomiting or muscle weakness.  Assessment/Plan:  No orders of the defined types were placed in this encounter.   Medications Discontinued During This Encounter  Medication Reason   Semaglutide,0.25 or 0.5MG /DOS, (OZEMPIC, 0.25 OR 0.5 MG/DOSE,) 2 MG/1.5ML SOPN Reorder   Vitamin D, Ergocalciferol, (DRISDOL) 1.25 MG (50000 UNIT) CAPS capsule Reorder     Meds ordered this encounter  Medications   Semaglutide,0.25 or 0.5MG /DOS, (OZEMPIC, 0.25 OR 0.5 MG/DOSE,) 2 MG/1.5ML SOPN    Sig: Inject 0.25 mg into the skin once a week.    Dispense:  1.5 mL    Refill:  0    30 d supply;  ** OV for RF **   Do not send RF request   Vitamin D,  Ergocalciferol, (DRISDOL) 1.25 MG (50000 UNIT) CAPS capsule    Sig: Take 1 capsule (50,000 Units total) by mouth every 7 (seven) days.    Dispense:  4 capsule    Refill:  0    Ov for RF; 30 d supply     1. Type 2 diabetes mellitus with other specified complication, without long-term current use of insulin (HCC) Good blood sugar control is important to decrease the likelihood of diabetic complications such as nephropathy, neuropathy, limb loss, blindness, coronary artery disease, and death. Intensive lifestyle modification including diet, exercise and weight loss are the first line of treatment for diabetes. Stephanie [name] declines increasing dose of Ozempic. Continue at same dose.  Refill- Semaglutide,0.25 or 0.5MG /DOS, (OZEMPIC, 0.25 OR 0.5 MG/DOSE,) 2 MG/1.5ML SOPN; Inject 0.25 mg into the skin once a week.  Dispense: 1.5 mL; Refill: 0  2. Vitamin D deficiency Low Vitamin D level contributes to fatigue and are associated with obesity, breast, and colon cancer. She agrees to continue to take prescription Vitamin D 50,000 IU every week and will follow-up for routine testing of Vitamin D, at least 2-3 times per year to avoid over-replacement.  Refill- Vitamin D, Ergocalciferol, (DRISDOL) 1.25 MG (50000 UNIT) CAPS capsule; Take 1 capsule (50,000 Units total) by mouth every 7 (seven) days.  Dispense: 4 capsule; Refill: 0  3. Obesity with current BMI of 47.65  Stephanie [name] is currently in the action stage of change. As such,  her goal is to continue with weight loss efforts. She has agreed to the Category 2 Plan and keeping a food journal and adhering to recommended goals of 200-300 calories and 20+ grams protein at breakfast.   Discussed with Stephanie [name] how to make protein pancakes and other higher protein treats.  Exercise goals:  Increase as tolerated.  Behavioral modification strategies: holiday eating strategies  and avoiding temptations.  Stephanie [name] has agreed to follow-up with our clinic in 2 weeks. She was informed of  the importance of frequent follow-up visits to maximize her success with intensive lifestyle modifications for her multiple health conditions.   Objective:   Blood pressure 138/65, pulse (!) 55, temperature 97.6 F (36.4 C), height 4\' 10"  (1.473 m), weight 188 lb (85.3 kg), SpO2 97 %. Body mass index is 39.29 kg/m.  General: Cooperative, alert, well developed, in no acute distress. HEENT: Conjunctivae and lids unremarkable. Cardiovascular: Regular rhythm.  Lungs: Normal work of breathing. Neurologic: No focal deficits.   Lab Results  Component Value Date   CREATININE 0.84 08/01/2021   BUN 22 08/01/2021   NA 143 08/01/2021   K 4.1 08/01/2021   CL 98 08/01/2021   CO2 26 08/01/2021   Lab Results  Component Value Date   ALT 20 08/01/2021   AST 20 08/01/2021   ALKPHOS 53 08/01/2021   BILITOT 0.4 08/01/2021   Lab Results  Component Value Date   HGBA1C 5.4 08/01/2021   HGBA1C 5.5 04/24/2021   HGBA1C 6.5 12/06/2020   HGBA1C 4.9 07/19/2018   Lab Results  Component Value Date   INSULIN 13.0 08/01/2021   INSULIN 15.6 02/12/2021   Lab Results  Component Value Date   TSH 1.850 02/12/2021   Lab Results  Component Value Date   CHOL 176 08/01/2021   HDL 67 08/01/2021   LDLCALC 73 08/01/2021   TRIG 224 (H) 08/01/2021   Lab Results  Component Value Date   VD25OH 72.0 08/01/2021   VD25OH 68.6 04/24/2021   VD25OH 39.4 12/06/2020   Lab Results  Component Value Date   WBC 5.3 02/12/2021   HGB 12.4 02/12/2021   HCT 37.3 02/12/2021   MCV 92 02/12/2021   PLT 256 02/12/2021   No results found for: IRON, TIBC, FERRITIN  Obesity Behavioral Intervention:   Approximately 15 minutes were spent on the discussion below.  ASK: We discussed the diagnosis of obesity with 02/14/2021 today and Dondra agreed to give Stephanie [name] permission to discuss obesity behavioral modification therapy today.  ASSESS: Alvia has the diagnosis of obesity and her BMI today is 39.4. Stephanie [name] is in the action  stage of change.   ADVISE: Stephanie [name] was educated on the multiple health risks of obesity as well as the benefit of weight loss to improve her health. She was advised of the need for long term treatment and the importance of lifestyle modifications to improve her current health and to decrease her risk of future health problems.  AGREE: Multiple dietary modification options and treatment options were discussed and Stephanie [name] agreed to follow the recommendations documented in the above note.  ARRANGE: Stephanie [name] was educated on the importance of frequent visits to treat obesity as outlined per CMS and USPSTF guidelines and agreed to schedule her next follow up appointment today.  Attestation Statements:   Reviewed by clinician on day of visit: allergies, medications, problem list, medical history, surgical history, family history, social history, and previous encounter notes.  Stephanie [name], CMA, am acting as transcriptionist for Edmund Hilda, DO.  I have reviewed the above documentation for accuracy and completeness, and I agree with the above. Carlye Grippe, D.O.  The 21st Century Cures Act was signed into law in 2016 which includes the topic of electronic health records.  This provides immediate access to information in MyChart.  This includes consultation notes, operative notes, office notes, lab results and pathology reports.  If you have any questions about what you read please let us know at your next visit so we can discuss your concerns and take corrective action if need be.  We are right here with you.

## 2021-08-16 DIAGNOSIS — G43909 Migraine, unspecified, not intractable, without status migrainosus: Secondary | ICD-10-CM | POA: Diagnosis not present

## 2021-08-28 ENCOUNTER — Encounter (INDEPENDENT_AMBULATORY_CARE_PROVIDER_SITE_OTHER): Payer: Self-pay | Admitting: Family Medicine

## 2021-08-28 ENCOUNTER — Ambulatory Visit (INDEPENDENT_AMBULATORY_CARE_PROVIDER_SITE_OTHER): Payer: Medicare Other | Admitting: Family Medicine

## 2021-08-28 ENCOUNTER — Other Ambulatory Visit: Payer: Self-pay

## 2021-08-28 VITALS — BP 132/78 | HR 61 | Temp 97.7°F | Ht <= 58 in | Wt 187.0 lb

## 2021-08-28 DIAGNOSIS — E860 Dehydration: Secondary | ICD-10-CM | POA: Diagnosis not present

## 2021-08-28 DIAGNOSIS — E669 Obesity, unspecified: Secondary | ICD-10-CM

## 2021-08-28 DIAGNOSIS — R519 Headache, unspecified: Secondary | ICD-10-CM | POA: Diagnosis not present

## 2021-08-28 DIAGNOSIS — E7849 Other hyperlipidemia: Secondary | ICD-10-CM

## 2021-08-28 DIAGNOSIS — E559 Vitamin D deficiency, unspecified: Secondary | ICD-10-CM | POA: Diagnosis not present

## 2021-08-28 DIAGNOSIS — E1169 Type 2 diabetes mellitus with other specified complication: Secondary | ICD-10-CM | POA: Diagnosis not present

## 2021-08-28 DIAGNOSIS — Z7984 Long term (current) use of oral hypoglycemic drugs: Secondary | ICD-10-CM | POA: Diagnosis not present

## 2021-08-28 DIAGNOSIS — Z9189 Other specified personal risk factors, not elsewhere classified: Secondary | ICD-10-CM

## 2021-08-28 DIAGNOSIS — Z6839 Body mass index (BMI) 39.0-39.9, adult: Secondary | ICD-10-CM

## 2021-08-28 MED ORDER — VITAMIN D (ERGOCALCIFEROL) 1.25 MG (50000 UNIT) PO CAPS: ORAL_CAPSULE | ORAL | 0 refills | Status: DC

## 2021-08-29 NOTE — Progress Notes (Signed)
Chief Complaint:   OBESITY Stephanie [name] is here to discuss her progress with her obesity treatment plan along with follow-up of her obesity related diagnoses. Stephanie [name] is on the Category 2 Plan and keeping a food journal and adhering to recommended goals of 200-300 calories and 20+ grams of protein at breakfast daily and states she is following her eating plan approximately 50% of the time. Stephanie [name] states she is walking for 10 minutes 3-4 times per week.  Today's visit was #: 13 Starting weight: 228 lbs Starting date: 02/12/2021 Today's weight: 187 lbs Today's date: 08/28/2021 Total lbs lost to date: 41 Total lbs lost since last in-office visit: 1  Interim History: Stephanie [name] is surprised that she lost weight over the holidays. Lately due to headaches, she hasn't been eating regular amounts of food. She is here to review labs that were drawn at her last office visit.  Subjective:   1. Type 2 diabetes mellitus with other specified complication, without long-term current use of insulin (HCC) Mirage's fasting blood sugars run at 107. She is on Ozempic, and she is tolerating it well at 0.25 mg weekly. She has no issues and she is on metformin as well. I discussed labs with the patient today.  2. Nonintractable headache, unspecified chronicity pattern, unspecified headache type Stephanie [name] is seeing a Neurologist next week for a work up of her headaches. Dr. Scarlett PrestoStamey from [facility] is her primary care physician, who tried her on Topamax 50 mg 1 tablet qhs, but with no help.   3. Vitamin D deficiency Stephanie [name] She is currently taking prescription vitamin D 50,000 IU each week. She denies nausea, vomiting or muscle weakness. I discussed labs with the patient today.  4. Mild dehydration [name]'s recent BUN and sodium are elevated. She is only drinking 3-4 bottles of water per day. I discussed labs with the patient today.  5. Other hyperlipidemia [name]'s triglycerides are elevated, and she has a decrease in HDL. She is  currently taking Crestor. I discussed labs with the patient today.  6. At risk for constipation Stephanie [name] is at increased risk for constipation due to dehydration.   Assessment/Plan:  No orders of the defined types were placed in this encounter.   Medications Discontinued During This Encounter  Medication Reason   Vitamin D, Ergocalciferol, (DRISDOL) 1.25 MG (50000 UNIT) CAPS capsule Reorder     Meds ordered this encounter  Medications   Vitamin D, Ergocalciferol, (DRISDOL) 1.25 MG (50000 UNIT) CAPS capsule    Sig: 1 po q 10d    Dispense:  3 capsule    Refill:  0    Ov for RF; 30 d supply     1. Type 2 diabetes mellitus with other specified complication, without long-term current use of insulin (HCC) Stephanie [name] will continue metformin and Ozempic. Good blood sugar control is important to decrease the likelihood of diabetic complications such as nephropathy, neuropathy, limb loss, blindness, coronary artery disease, and death. Intensive lifestyle modification including diet, exercise and weight loss are the first line of treatment for diabetes.   2. Nonintractable headache, unspecified chronicity pattern, unspecified headache type Stephanie [name] will follow up with Neurology as planned.  3. Vitamin D deficiency Stephanie [name] agreed to decrease prescription Vitamin D 50,000 IU to every 10 days, and we will refill for 1 month. She will follow-up for routine testing of Vitamin D, at least 2-3 times per year to avoid over-replacement.  - Vitamin D, Ergocalciferol, (DRISDOL) 1.25 MG (50000 UNIT) CAPS capsule; 1 po q 10d  Dispense: 3 capsule; Refill: 0  4. Mild dehydration Stephanie [name] is to increase her water intake to 5 bottles of water per day.  5. Other hyperlipidemia Cardiovascular risk and specific lipid/LDL goals reviewed.  We discussed several lifestyle modifications today. Keelie will continue Crestor, and will continue to work on diet, exercise and weight loss efforts. Orders and follow up as documented in  patient record.   Counseling Intensive lifestyle modifications are the first line treatment for this issue. Dietary changes: Increase soluble fiber. Decrease simple carbohydrates. Exercise changes: Moderate to vigorous-intensity aerobic activity 150 minutes per week if tolerated. Lipid-lowering medications: see documented in medical record.  6. At risk for constipation Zen was given approximately 9 minutes of counseling today regarding prevention of constipation. She was encouraged to increase water and fiber intake.   7. Obesity with current BMI 39.2 Stephanie [name] is currently in the action stage of change. As such, her goal is to continue with weight loss efforts. She has agreed to the Category 2 Plan and keeping a food journal and adhering to recommended goals of 200-300 calories and 20+ grams of protein at breakfast daily.   Exercise goals: As is, but increase. Stephanie [name] is to try to achieve walking for 10 minutes 5 days per week.  Behavioral modification strategies: avoiding temptations and planning for success.  Stephanie [name] has agreed to follow-up with our clinic in 2 to 3 weeks. She was informed of the importance of frequent follow-up visits to maximize her success with intensive lifestyle modifications for her multiple health conditions.   Objective:   Blood pressure 132/78, pulse 61, temperature 97.7 F (36.5 C), height 4\' 10"  (1.473 m), weight 187 lb (84.8 kg), SpO2 97 %. Body mass index is 39.08 kg/m.  General: Cooperative, alert, well developed, in no acute distress. HEENT: Conjunctivae and lids unremarkable. Cardiovascular: Regular rhythm.  Lungs: Normal work of breathing. Neurologic: No focal deficits.   Lab Results  Component Value Date   CREATININE 0.84 08/01/2021   BUN 22 08/01/2021   NA 143 08/01/2021   K 4.1 08/01/2021   CL 98 08/01/2021   CO2 26 08/01/2021   Lab Results  Component Value Date   ALT 20 08/01/2021   AST 20 08/01/2021   ALKPHOS 53 08/01/2021   BILITOT  0.4 08/01/2021   Lab Results  Component Value Date   HGBA1C 5.4 08/01/2021   HGBA1C 5.5 04/24/2021   HGBA1C 6.5 12/06/2020   HGBA1C 4.9 07/19/2018   Lab Results  Component Value Date   INSULIN 13.0 08/01/2021   INSULIN 15.6 02/12/2021   Lab Results  Component Value Date   TSH 1.850 02/12/2021   Lab Results  Component Value Date   CHOL 176 08/01/2021   HDL 67 08/01/2021   LDLCALC 73 08/01/2021   TRIG 224 (H) 08/01/2021   Lab Results  Component Value Date   VD25OH 72.0 08/01/2021   VD25OH 68.6 04/24/2021   VD25OH 39.4 12/06/2020   Lab Results  Component Value Date   WBC 5.3 02/12/2021   HGB 12.4 02/12/2021   HCT 37.3 02/12/2021   MCV 92 02/12/2021   PLT 256 02/12/2021   No results found for: IRON, TIBC, FERRITIN  Attestation Statements:   Reviewed by clinician on day of visit: allergies, medications, problem list, medical history, surgical history, family history, social history, and previous encounter notes.   02/14/2021, am acting as transcriptionist for [name], DO.  I have reviewed the above documentation for accuracy and completeness, and I agree  with the above. Carlye Grippe, D.O.  The 21st Century Cures Act was signed into law in 2016 which includes the topic of electronic health records.  This provides immediate access to information in MyChart.  This includes consultation notes, operative notes, office notes, lab results and pathology reports.  If you have any questions about what you read please let us know at your next visit so we can discuss your concerns and take corrective action if need be.  We are right here with you.

## 2021-09-05 ENCOUNTER — Encounter: Payer: Self-pay | Admitting: *Deleted

## 2021-09-06 ENCOUNTER — Ambulatory Visit: Payer: Medicare Other | Admitting: Neurology

## 2021-09-06 ENCOUNTER — Encounter: Payer: Self-pay | Admitting: Neurology

## 2021-09-06 VITALS — BP 128/76 | HR 54 | Ht 58.75 in | Wt 188.8 lb

## 2021-09-06 DIAGNOSIS — R5383 Other fatigue: Secondary | ICD-10-CM

## 2021-09-06 DIAGNOSIS — R51 Headache with orthostatic component, not elsewhere classified: Secondary | ICD-10-CM

## 2021-09-06 DIAGNOSIS — Z86718 Personal history of other venous thrombosis and embolism: Secondary | ICD-10-CM

## 2021-09-06 DIAGNOSIS — G4484 Primary exertional headache: Secondary | ICD-10-CM | POA: Diagnosis not present

## 2021-09-06 DIAGNOSIS — R519 Headache, unspecified: Secondary | ICD-10-CM | POA: Diagnosis not present

## 2021-09-06 DIAGNOSIS — G08 Intracranial and intraspinal phlebitis and thrombophlebitis: Secondary | ICD-10-CM | POA: Diagnosis not present

## 2021-09-06 DIAGNOSIS — R531 Weakness: Secondary | ICD-10-CM | POA: Diagnosis not present

## 2021-09-06 MED ORDER — UBRELVY 100 MG PO TABS: 100.0000 mg | ORAL_TABLET | ORAL | 0 refills | Status: DC | PRN

## 2021-09-06 MED ORDER — PREDNISONE 20 MG PO TABS
60.0000 mg | ORAL_TABLET | Freq: Every day | ORAL | 0 refills | Status: DC
Start: 2021-09-06 — End: 2021-09-11

## 2021-09-06 NOTE — Patient Instructions (Addendum)
Start steroids 60mg  start today I'll call you with results of labwork tomorrow and hopefully if these are negative you can stop prednisone. MRI of the brain and blood vessels In the meantime Ubrelvy as needed for probable migraine: samples as needed for headache. Please take one tablet at the onset of your headache. If it does not improve the symptoms please take one additional tablet in 2 hours. Do not take more then 2 tablets in 24hrs.  Call 911 for changes Eye exam as soon as can   A migraine is a headache that can cause severe throbbing pain or a pulsing sensation, usually on one side of the head. It's often accompanied by nausea, vomiting, and extreme sensitivity to light and sound.  Rule this out: Temporal Arteritis Temporal arteritis is a condition that causes arteries to become inflamed. It usually affects arteries in your head and face, but arteries in any part of the body can become inflamed. The condition is also called giant cell arteritis.  Temporal arteritis can cause serious problems such as blindness. Early treatment can help prevent these problems. What are the causes? The cause of this condition is not known. What increases the risk? The following factors may make you more likely to develop this condition: Being older than 50. Being a woman. Being Caucasian. Being of [place], [place], [place], [place] or [place] ancestry. Having a family history of the condition. Having a certain condition that causes muscle pain and stiffness (polymyalgia rheumatica, PMR). What are the signs or symptoms? Some people with temporal arteritis have just one symptom, while others have several symptoms. Most symptoms are related to the head and face. These may include: Headache. Hard, swollen, or tender temples. This is common. Your temples are the areas on either side of your forehead. Pain when combing your hair or when laying your head down. Pain in the jaw when chewing. Pain in the  throat or tongue. Problems with your vision, such as sudden loss of vision in one eye, or seeing double. Other symptoms may include: Fever. Tiredness (fatigue). A dry cough. Pain in the hips and shoulders. Pain in the arms during exercise. Depression. Weight loss. How is this diagnosed? This condition may be diagnosed based on: Your symptoms. Your medical history. A physical exam. Tests, including: Blood tests. A test in which a tissue sample is removed from an artery so it can be examined (biopsy). Imaging tests, such as an ultrasound or MRI. How is this treated? This condition may be treated with: A type of medicine to reduce inflammation (corticosteroid). Medicines to weaken your immune system (immunosuppressants). Other medicines to treat vision problems. You will need to see your health care provider while you are being treated. The medicines used to treat this condition can increase your risk of problems such as bone loss and diabetes. During follow-up visits, your health care provider will check for problems by: Doing blood tests and bone density tests. Checking your blood pressure and blood sugar. Follow these instructions at home: Medicines Take over-the-counter and prescription medicines only as told by your health care provider. Take any vitamins or supplements recommended by your health care provider. These may include vitamin D and calcium, which help keep your bones from becoming weak. Eating and drinking  Eat a heart-healthy diet. This may include: Eating high-fiber foods, such as fresh fruits and vegetables, whole grains, and beans. Eating heart-healthy fats (omega-3 fats), such as fish, flaxseed, and flaxseed oil. Limiting foods that are high in saturated fat and  cholesterol, such as processed and fried foods, fatty meat, and full-fat dairy. Limiting how much salt (sodium) you eat. Include calcium and vitamin D in your diet. Good sources of calcium and vitamin D  include: Low-fat dairy products such as milk, yogurt, and cheese. Certain fish, such as fresh or canned salmon, tuna, and sardines. Products that have calcium and vitamin D added to them (fortified products), such as fortified cereals or juice. General instructions Exercise. Talk with your health care provider about what exercises are okay for you. Exercises that increase your heart rate (aerobic exercise), such as walking, are often recommended. Aerobic exercise helps control your blood pressure and prevent bone loss. Stay up to date on all vaccines as directed by your health care provider. Keep all follow-up visits as told by your health care provider. This is important. Contact a health care provider if: Your symptoms get worse. You develop signs of infection, such as fever, swelling, redness, warmth, and tenderness. Get help right away if: You lose your vision. Your pain does not go away, even after you take medicine. You have chest pain. You have trouble breathing. One side of your face or body suddenly becomes weak or numb. These symptoms may represent a serious problem that is an emergency. Do not wait to see if the symptoms will go away. Get medical help right away. Call your local emergency services (911 in the [place] Do not drive yourself to the hospital. Summary Temporal arteritis is a condition that causes arteries to become inflamed. It usually affects arteries in your head and face. This condition can cause serious problems, such as blindness. Treatment can help prevent these problems. Symptoms may include hard or tender temples, pain in your jaw when chewing, problems with your vision, or pain in your hips and shoulders. Take over-the-counter and prescription medicines as told by your health care provider. This information is not intended to replace advice given to you by your health care provider. Make sure you discuss any questions you have with your health care  provider. Document Revised: 10/16/2020 Document Reviewed: 10/16/2020 Elsevier Patient Education  2022 [place] tablets What is this medication? UBROGEPANT (ue BROE je pant) is used to treat migraine headaches with or without aura. An aura is a strange feeling or visual disturbance that warns you of an attack. It is not used to prevent migraines. This medicine may be used for other purposes; ask your health care provider or pharmacist if you have questions. COMMON BRAND NAME(S): Roselyn Meier What should I tell my care team before I take this medication? They need to know if you have any of these conditions: kidney disease liver disease an unusual or allergic reaction to ubrogepant, other medicines, foods, dyes, or preservatives pregnant or trying to get pregnant breast-feeding How should I use this medication? Take this medicine by mouth with a glass of water. Follow the directions on the prescription label. You can take it with or without food. If it upsets your stomach, take it with food. Take your medicine at regular intervals. Do not take it more often than directed. Do not stop taking except on your doctor's advice. Talk to your pediatrician about the use of this medicine in children. Special care may be needed. Overdosage: If you think you have taken too much of this medicine contact a poison control center or emergency room at once. NOTE: This medicine is only for you. Do not share this medicine with others. What if I miss a dose? This  does not apply. This medicine is not for regular use. What may interact with this medication? Do not take this medicine with any of the following medicines: ceritinib certain antibiotics like chloramphenicol, clarithromycin, telithromycin certain antivirals for HIV like atazanavir, cobicistat, darunavir, delavirdine, fosamprenavir, indinavir, ritonavir certain medicines for fungal infections like itraconazole, ketoconazole, posaconazole,  voriconazole conivaptan grapefruit idelalisib mifepristone nefazodone ribociclib This medicine may also interact with the following medications: carvedilol certain medicines for seizures like phenobarbital, phenytoin ciprofloxacin cyclosporine eltrombopag fluconazole fluvoxamine quinidine rifampin St. John's wort verapamil This list may not describe all possible interactions. Give your health care provider a list of all the medicines, herbs, non-prescription drugs, or dietary supplements you use. Also tell them if you smoke, drink alcohol, or use illegal drugs. Some items may interact with your medicine. What should I watch for while using this medication? Visit your health care professional for regular checks on your progress. Tell your health care professional if your symptoms do not start to get better or if they get worse. Your mouth may get dry. Chewing sugarless gum or sucking hard candy and drinking plenty of water may help. Contact your health care professional if the problem does not go away or is severe. What side effects may I notice from receiving this medication? Side effects that you should report to your doctor or health care professional as soon as possible: allergic reactions like skin rash, itching or hives; swelling of the face, lips, or tongue Side effects that usually do not require medical attention (report these to your doctor or health care professional if they continue or are bothersome): drowsiness dry mouth nausea tiredness This list may not describe all possible side effects. Call your doctor for medical advice about side effects. You may report side effects to FDA at 1-800-FDA-1088. Where should I keep my medication? Keep out of the reach of children. Store at room temperature between 15 and 30 degrees C (59 and 86 degrees F). Throw away any unused medicine after the expiration date. NOTE: This sheet is a summary. It may not cover all possible  information. If you have questions about this medicine, talk to your doctor, pharmacist, or health care provider.  2022 Elsevier/Gold Standard (2018-10-21 00:00:00) Prednisone Tablets What is this medication? PREDNISONE (PRED ni sone) treats many conditions such as asthma, allergic reactions, arthritis, inflammatory bowel diseases, adrenal, and blood or bone marrow disorders. It works by decreasing inflammation, slowing down an overactive immune system, or replacing cortisol normally made in the body. Cortisol is a hormone that plays an important role in how the body responds to stress, illness, and injury. It belongs to a group of medications called steroids. This medicine may be used for other purposes; ask your health care provider or pharmacist if you have questions. COMMON BRAND NAME(S): Deltasone, Predone, Sterapred, Sterapred DS What should I tell my care team before I take this medication? They need to know if you have any of these conditions: Cushing's syndrome Diabetes Glaucoma Heart disease High blood pressure Infection (especially a virus infection such as chickenpox, cold sores, or herpes) Kidney disease Liver disease Mental illness Myasthenia gravis Osteoporosis Seizures Stomach or intestine problems Thyroid disease An unusual or allergic reaction to lactose, prednisone, other medications, foods, dyes, or preservatives Pregnant or trying to get pregnant Breast-feeding How should I use this medication? Take this medication by mouth with a glass of water. Follow the directions on the prescription label. Take this medication with food. If you are taking this medication once  a day, take it in the morning. Do not take more medication than you are told to take. Do not suddenly stop taking your medication because you may develop a severe reaction. Your care team will tell you how much medication to take. If your care team wants you to stop the medication, the dose may be slowly  lowered over time to avoid any side effects. Talk to your care team about the use of this medication in children. Special care may be needed. Overdosage: If you think you have taken too much of this medicine contact a poison control center or emergency room at once. NOTE: This medicine is only for you. Do not share this medicine with others. What if I miss a dose? If you miss a dose, take it as soon as you can. If it is almost time for your next dose, talk to your care team. You may need to miss a dose or take an extra dose. Do not take double or extra doses without advice. What may interact with this medication? Do not take this medication with any of the following: Metyrapone Mifepristone This medication may also interact with the following: Aminoglutethimide Amphotericin B Aspirin and aspirin-like medications Barbiturates Certain medications for diabetes, like glipizide or glyburide Cholestyramine Cholinesterase inhibitors Cyclosporine Digoxin Diuretics Ephedrine Female hormones, like estrogens and birth control pills Isoniazid Ketoconazole NSAIDS, medications for pain and inflammation, like ibuprofen or naproxen Phenytoin Rifampin Toxoids Vaccines Warfarin This list may not describe all possible interactions. Give your health care provider a list of all the medicines, herbs, non-prescription drugs, or dietary supplements you use. Also tell them if you smoke, drink alcohol, or use illegal drugs. Some items may interact with your medicine. What should I watch for while using this medication? Visit your care team for regular checks on your progress. If you are taking this medication over a prolonged period, carry an identification card with your name and address, the type and dose of your medication, and your care team's name and address. This medication may increase your risk of getting an infection. Tell your care team if you are around anyone with measles or chickenpox, or if  you develop sores or blisters that do not heal properly. If you are going to have surgery, tell your care team that you have taken this medication within the last twelve months. Ask your care team about your diet. You may need to lower the amount of salt you eat. This medication may increase blood sugar. Ask your care team if changes in diet or medications are needed if you have diabetes. What side effects may I notice from receiving this medication? Side effects that you should report to your care team as soon as possible: Allergic reactions--skin rash, itching, hives, swelling of the face, lips, tongue, or throat Cushing syndrome--increased fat around the midsection, upper back, neck, or face, pink or purple stretch marks on the skin, thinning, fragile skin that easily bruises, unexpected hair growth High blood sugar (hyperglycemia)--increased thirst or amount of urine, unusual weakness or fatigue, blurry vision Increase in blood pressure Infection--fever, chills, cough, sore throat, wounds that don't heal, pain or trouble when passing urine, general feeling of discomfort or being unwell Low adrenal gland function--nausea, vomiting, loss of appetite, unusual weakness or fatigue, dizziness Mood and behavior changes--anxiety, nervousness, confusion, hallucinations, irritability, hostility, thoughts of suicide or self-harm, worsening mood, feelings of depression Stomach bleeding--bloody or black, tar-like stools, vomiting blood or brown material that looks like coffee grounds Swelling of  the ankles, hands, or feet Side effects that usually do not require medical attention (report to your care team if they continue or are bothersome): Acne General discomfort and fatigue Headache Increase in appetite Nausea Trouble sleeping Weight gain This list may not describe all possible side effects. Call your doctor for medical advice about side effects. You may report side effects to FDA at  1-800-FDA-1088. Where should I keep my medication? Keep out of the reach of children. Store at room temperature between 15 and 30 degrees C (59 and 86 degrees F). Protect from light. Keep container tightly closed. Throw away any unused medication after the expiration date. NOTE: This sheet is a summary. It may not cover all possible information. If you have questions about this medicine, talk to your doctor, pharmacist, or health care provider.  2022 Elsevier/Gold Standard (2020-11-02 00:00:00)

## 2021-09-06 NOTE — Progress Notes (Addendum)
[facility]    Provider:  Dr Jaynee Eagles Requesting Provider: Larey Dresser, Girtha Rm, FNP Primary Care Provider:  Orpah Melter, MD  CC:  Headaches  HPI:  Stephanie [name] is a 71 y.o. female here as requested by Stamey, Girtha Rm, FNP for headaches. PMHx DVT, anxiety, depression, high cholesterol, HTN, DM2.    No significnt hx for headaches. She has started having headaches, just started hurting, starts around 10-11 am and by lunch time it hurts a lot, on the right, pounding, pulsating, hurts around and behind the left eye, her eyes feel sore, happening every day, 6-7 weeks, severe, tylenol and advil help a little bit, worse standing, reclining and laying makes it better, worse up and walking. They tried Topiramate but just for a few weeks. Hurts to move eyes. Daughter has migraines. No jaw pain on chewing. Worse with lights and noise. No nausea. No vision changes. No jaw pain. No other focal neurologic deficits, associated symptoms, inciting events or modifiable factors.  Reviewed notes, labs and imaging from outside physicians, which showed:  I reviewed notes, no hx of headache except in 2020 with diarrhea and + covid, reviewed [facility] from urgent care   07/15/2019: US venous  IMPRESSION: No evidence of acute or chronic DVT within the right lower extremity.    2011: US doppler Comparison:  None     Findings:  Normal compressibility of  the right common femoral,  superficial femoral, and popliteal veins is demonstrated, as well  as the visualized proximal calf veins.  No filling defects to  suggest DVT on grayscale or color Doppler imaging.  Doppler  waveforms show normal direction of venous flow, normal respiratory  phasicity and response to augmentation.     IMPRESSION:  No evidence of  right lower extremity deep vein thrombosis.  Medications tried than can be used in migraine management include: Tylenol, atenolol, aspirin, Zoloft, amitriptyline/nortriptyline  contraindicated since she is on Zoloft, gabapentin, ibuprofen, ketorolac injections, lisinopril, ondansetron injections and tablets, prednisone tablets, Topamax, tramadol, Phenergan injections, Ubrelvy helped,  Review of Systems: Patient complains of symptoms per HPI as well as the following symptoms headaches, hx of dvt. Pertinent negatives and positives per HPI. All others negative.   Social History   Socioeconomic History   Marital status: Married    Spouse name: Not on file   Number of children: Not on file   Years of education: Not on file   Highest education level: Not on file  Occupational History   Occupation: Retired  Tobacco Use   Smoking status: Former    Packs/day: 1.00    Types: Cigarettes    Quit date: 1984    Years since quitting: 39.0   Smokeless tobacco: Never  Vaping Use   Vaping Use: Never used  Substance and Sexual Activity   Alcohol use: No   Drug use: Never   Sexual activity: Not Currently    Birth control/protection: Surgical  Other Topics Concern   Not on file  Social History Narrative   Caffeine 1 cup coffee daily.  Education" 10 th grade.  Working retired (loving it).  Married,  Children 2 5 grandkids.    Social Determinants of Health   Financial Resource Strain: Not on file  Food Insecurity: Not on file  Transportation Needs: Not on file  Physical Activity: Not on file  Stress: Not on file  Social Connections: Not on file  Intimate Partner Violence: Not on file    Family History  Problem Relation  Age of Onset   Anxiety disorder Mother    Depression Mother    Kidney disease Mother    Heart disease Mother    Hyperlipidemia Mother    Hypertension Mother    Stroke Father    Heart disease Father    Hyperlipidemia Father    Hypertension Father    Diabetes Father    Migraines Neg Hx     Past Medical History:  Diagnosis Date   Anxiety    Asthma    Back pain    Depression    Depression    DVT (deep venous thrombosis) ([place])     "LLE; same time as I had hysterectomy"   Family history of adverse reaction to anesthesia    "my momma gets real sick" (07/27/2018)   Food allergy    GERD (gastroesophageal reflux disease)    High cholesterol    HOH (hard of hearing)    hears better out of left.   Hypertension    Joint pain    Seasonal asthma    Shortness of breath on exertion    Type 2 diabetes, diet controlled ([place])    Vitamin D deficiency     Patient Active Problem List   Diagnosis Date Noted   Mixed diabetic hyperlipidemia associated with type 2 diabetes mellitus ([place]) 02/12/2021   Hypertension associated with diabetes ([place]) 02/12/2021   Diabetes mellitus ([place]) 02/12/2021   B12 deficiency 02/12/2021   Vitamin D deficiency 02/12/2021   S/P partial resection of colon 07/26/2018   SARCOIDOSIS 0000000   METABOLIC SYNDROME X 0000000   G E R D 03/15/2007   SYMPTOM, POLYDIPSIA 03/15/2007   SYMPTOM, HEADACHE 03/15/2007    Past Surgical History:  Procedure Laterality Date   ABDOMINAL HYSTERECTOMY     CARPAL TUNNEL RELEASE Bilateral    COLECTOMY  07/26/2018   "for diverticulitis"   COLON RESECTION N/A 07/26/2018   Procedure: LAPAROSCOPIC SIGMOID COLON RESECTION ERAS PATHWAY;  Surgeon: Ralene Ok, MD;  Location: [facility];  Service: General;  Laterality: N/A;   COLONOSCOPY W/ BIOPSIES AND POLYPECTOMY     "benign" (07/27/2018)   [place] Bilateral 2006   SIGMOIDOSCOPY N/A 07/26/2018   Procedure: SIGMOIDOSCOPY;  Surgeon: Ralene Ok, MD;  Location: [facility];  Service: General;  Laterality: N/A;   TUBAL LIGATION      Current Outpatient Medications  Medication Sig Dispense Refill   acetaminophen (TYLENOL) 500 MG tablet Take 1,000 mg by mouth every 8 (eight) hours as needed.     albuterol (PROVENTIL HFA;VENTOLIN HFA) 108 (90 BASE) MCG/ACT inhaler Inhale 2 puffs into the lungs every 6 (six) hours as needed for wheezing or shortness of breath.      aspirin EC 81  MG tablet Take 81 mg by mouth daily.     atenolol (TENORMIN) 25 MG tablet Take 25 mg by mouth daily.      fluticasone-salmeterol (ADVAIR) 100-50 MCG/ACT AEPB Inhale 1 puff into the lungs daily.     hydrochlorothiazide (MICROZIDE) 12.5 MG capsule Take 25 mg by mouth daily.     ibuprofen (ADVIL) 200 MG tablet Take 400 mg by mouth every 6 (six) hours as needed.     levocetirizine (XYZAL) 5 MG tablet Take 5 mg by mouth every evening.     metFORMIN (GLUCOPHAGE) 500 MG tablet Take 500 mg by mouth 2 (two) times daily with a meal.     omeprazole (PRILOSEC) 40 MG capsule Take 40 mg by mouth daily.  predniSONE (DELTASONE) 20 MG tablet Take 3 tablets (60 mg total) by mouth daily. 21 tablet 0   rosuvastatin (CRESTOR) 5 MG tablet Take 5 mg by mouth daily.     Semaglutide,0.25 or 0.5MG /DOS, (OZEMPIC, 0.25 OR 0.5 MG/DOSE,) 2 MG/1.5ML SOPN Inject 0.25 mg into the skin once a week. 1.5 mL 0   sertraline (ZOLOFT) 100 MG tablet Take 100 mg by mouth daily.     topiramate (TOPAMAX) 50 MG tablet Take 50 mg by mouth 2 (two) times daily. 2 tablets orally twice daily     Ubrogepant (UBRELVY) 100 MG TABS Take 100 mg by mouth every 2 (two) hours as needed. Maximum 200mg  a day. 16 tablet 0   vitamin B-12 (CYANOCOBALAMIN) 500 MCG tablet Take 500 mcg by mouth daily.     Vitamin D, Ergocalciferol, (DRISDOL) 1.25 MG (50000 UNIT) CAPS capsule 1 po q 10d 3 capsule 0   zolpidem (AMBIEN) 10 MG tablet Take 10 mg by mouth at bedtime as needed for sleep.     No current facility-administered medications for this visit.    Allergies as of 09/06/2021 - Review Complete 09/06/2021  Allergen Reaction Noted   Elderberry Anaphylaxis 09/06/2021   Shellfish allergy Anaphylaxis and Hives 09/29/2017   Albuterol  09/29/2017   Flagyl [metronidazole] Nausea Only 09/29/2017    Vitals: BP 128/76    Pulse (!) 54    Ht 4' 10.75" (1.492 m)    Wt 188 lb 12.8 oz (85.6 kg)    BMI 38.46 kg/m  Last Weight:  Wt Readings from Last 1 Encounters:   09/06/21 188 lb 12.8 oz (85.6 kg)   Last Height:   Ht Readings from Last 1 Encounters:  09/06/21 4' 10.75" (1.492 m)     Physical exam: Exam: Gen: NAD, conversant, well nourised, obese, well groomed                     CV: RRR, no MRG. No Carotid Bruits. No peripheral edema, warm, nontender Eyes: Conjunctivae clear without exudates or hemorrhage  Neuro: Detailed Neurologic Exam  Speech:    Speech is normal; fluent and spontaneous with normal comprehension.  Cognition:    The patient is oriented to person, place, and time;     recent and remote memory intact;     language fluent;     normal attention, concentration,     fund of knowledge Cranial Nerves:    The pupils are equal, round, and reactive to light. Pupils too small to visualize fundi. Visual fields are full to finger confrontation. Extraocular movements are intact. Trigeminal sensation is intact and the muscles of mastication are normal. Slight assymmetry lids likely aging, otherwise the face is symmetric. The palate elevates in the midline. Hearing intact. Voice is normal. Shoulder shrug is normal. The tongue has normal motion without fasciculations.   Coordination:    Normal  Gait:    normal.   Motor Observation:    No asymmetry, no atrophy, and no involuntary movements noted. Tone:    Normal muscle tone.    Posture:    Posture is normal. normal erect    Strength: left deltoid 4+, and triceps 4+, otherwise strength is V/V in the upper and lower limbs.      Sensation: intact to LT     Reflex Exam:  DTR's:    Deep tendon reflexes in the upper and lower extremities are symmetrical bilaterally.   Toes:    The toes are downgoing bilaterally.  Clonus:    Clonus is absent.    Assessment/Plan:  Patient without a hx of headaches with very concerning new onset intractable right-sided daily headache, left arm weakness. Concerning for right-sided brain etiology such as mass, stroke, cerebral venous thrombosis,  other lesion. Has a hx of venous thrombosis in the left leg.  Needs MRI/MRV, discussed for any worsening go to the emergency room or call 911  - MRI/MRV as above( addendum 09/22/2021, without etiology of headaches, likely migrainous, will treat as such) -In the meantime Ubrelvy as needed for probable migraine: samples as needed for headache. Please take one tablet at the onset of your headache. If it does not improve the symptoms please take one additional tablet in 2 hours. Do not take more then 2 tablets in 24hrs.  -Recommended eye exam as soon as possible to rule out problems with the right eye such as glaucoma or other globe problems causing headaches - If workup negative can consider treating for migraines but concerning new left arm weakness with right-sided headache need to get imaging as quickly as we can. Explained this takes time in outpatient, we need insurance approval and scheduling, for any worsening or anything concerning call 911 or go to the emergency room  Orders Placed This Encounter  Procedures   MR BRAIN W WO CONTRAST   MR MRV HEAD WO CM   Sedimentation rate   C-reactive protein   TSH   T4, Free   Meds ordered this encounter  Medications   predniSONE (DELTASONE) 20 MG tablet    Sig: Take 3 tablets (60 mg total) by mouth daily.    Dispense:  21 tablet    Refill:  0   Ubrogepant (UBRELVY) 100 MG TABS    Sig: Take 100 mg by mouth every 2 (two) hours as needed. Maximum 200mg  a day.    Dispense:  16 tablet    Refill:  0    Cc: Stamey, Girtha Rm, FNP,  Orpah Melter, MD  Sarina Ill, MD  [facility] [address] [place], [place] [address]  Phone [phone] Fax [phone]

## 2021-09-07 ENCOUNTER — Telehealth: Payer: Self-pay | Admitting: Neurology

## 2021-09-07 LAB — TSH: TSH: 1.74 u[IU]/mL (ref 0.450–4.500)

## 2021-09-07 LAB — T4, FREE: Free T4: 1.07 ng/dL (ref 0.82–1.77)

## 2021-09-07 LAB — C-REACTIVE PROTEIN: CRP: 3 mg/L (ref 0–10)

## 2021-09-07 LAB — SEDIMENTATION RATE: Sed Rate: 2 mm/hr (ref 0–40)

## 2021-09-07 NOTE — Telephone Encounter (Signed)
I called and spoke to patient, ESR and CRP were normal, told her she can stop the steroids.  She took Nurtec and she is feeling better today.  I did tell her she could take 1 daily which may help with her headaches until we get the MRI of the brain completed.  Patient was appreciative.  We also discussed again why I checked ESR and CRP, ruling out temporal arteritis, explained the condition again.

## 2021-09-08 ENCOUNTER — Encounter: Payer: Self-pay | Admitting: Neurology

## 2021-09-09 ENCOUNTER — Telehealth: Payer: Self-pay | Admitting: Neurology

## 2021-09-09 DIAGNOSIS — E1169 Type 2 diabetes mellitus with other specified complication: Secondary | ICD-10-CM | POA: Diagnosis not present

## 2021-09-09 DIAGNOSIS — I10 Essential (primary) hypertension: Secondary | ICD-10-CM | POA: Diagnosis not present

## 2021-09-09 DIAGNOSIS — J309 Allergic rhinitis, unspecified: Secondary | ICD-10-CM | POA: Diagnosis not present

## 2021-09-09 DIAGNOSIS — K219 Gastro-esophageal reflux disease without esophagitis: Secondary | ICD-10-CM | POA: Diagnosis not present

## 2021-09-09 DIAGNOSIS — Z1239 Encounter for other screening for malignant neoplasm of breast: Secondary | ICD-10-CM | POA: Diagnosis not present

## 2021-09-09 DIAGNOSIS — E7800 Pure hypercholesterolemia, unspecified: Secondary | ICD-10-CM | POA: Diagnosis not present

## 2021-09-09 DIAGNOSIS — G4700 Insomnia, unspecified: Secondary | ICD-10-CM | POA: Diagnosis not present

## 2021-09-09 DIAGNOSIS — J45909 Unspecified asthma, uncomplicated: Secondary | ICD-10-CM | POA: Diagnosis not present

## 2021-09-09 NOTE — Telephone Encounter (Signed)
[employer] medicare no auth req sent a message for GI, Stephanie [name] a message with Gi to see if they can schedule the patient as soon as possible.

## 2021-09-09 NOTE — Telephone Encounter (Signed)
Just an FYI    Patient is scheduled at GI for 09/10/21.

## 2021-09-10 ENCOUNTER — Ambulatory Visit
Admission: RE | Admit: 2021-09-10 | Discharge: 2021-09-10 | Disposition: A | Discharge status: Home / Self Care | Payer: Medicare Other | Source: Ambulatory Visit | Attending: Neurology | Admitting: Neurology

## 2021-09-10 ENCOUNTER — Other Ambulatory Visit: Payer: Self-pay

## 2021-09-10 DIAGNOSIS — Z86718 Personal history of other venous thrombosis and embolism: Secondary | ICD-10-CM

## 2021-09-10 DIAGNOSIS — R519 Headache, unspecified: Secondary | ICD-10-CM | POA: Diagnosis not present

## 2021-09-10 DIAGNOSIS — R51 Headache with orthostatic component, not elsewhere classified: Secondary | ICD-10-CM

## 2021-09-10 DIAGNOSIS — R531 Weakness: Secondary | ICD-10-CM

## 2021-09-10 DIAGNOSIS — G4484 Primary exertional headache: Secondary | ICD-10-CM

## 2021-09-10 DIAGNOSIS — G08 Intracranial and intraspinal phlebitis and thrombophlebitis: Secondary | ICD-10-CM

## 2021-09-10 MED ORDER — GADOBENATE DIMEGLUMINE 529 MG/ML IV SOLN
17.0000 mL | Freq: Once | INTRAVENOUS | Status: AC | PRN
  Administered 2021-09-10: 17 mL via INTRAVENOUS

## 2021-09-11 ENCOUNTER — Encounter (INDEPENDENT_AMBULATORY_CARE_PROVIDER_SITE_OTHER): Payer: Self-pay | Admitting: Family Medicine

## 2021-09-11 ENCOUNTER — Ambulatory Visit (INDEPENDENT_AMBULATORY_CARE_PROVIDER_SITE_OTHER): Payer: Medicare Other | Admitting: Family Medicine

## 2021-09-11 VITALS — BP 141/83 | HR 63 | Temp 98.1°F | Ht <= 58 in | Wt 184.0 lb

## 2021-09-11 DIAGNOSIS — E669 Obesity, unspecified: Secondary | ICD-10-CM

## 2021-09-11 DIAGNOSIS — Z6838 Body mass index (BMI) 38.0-38.9, adult: Secondary | ICD-10-CM

## 2021-09-11 DIAGNOSIS — Z6841 Body Mass Index (BMI) 40.0 and over, adult: Secondary | ICD-10-CM

## 2021-09-11 DIAGNOSIS — E1169 Type 2 diabetes mellitus with other specified complication: Secondary | ICD-10-CM | POA: Diagnosis not present

## 2021-09-11 DIAGNOSIS — E559 Vitamin D deficiency, unspecified: Secondary | ICD-10-CM

## 2021-09-11 DIAGNOSIS — R519 Headache, unspecified: Secondary | ICD-10-CM

## 2021-09-11 MED ORDER — OZEMPIC (0.25 OR 0.5 MG/DOSE) 2 MG/1.5ML ~~LOC~~ SOPN: 0.2500 mg | PEN_INJECTOR | SUBCUTANEOUS | 0 refills | Status: DC

## 2021-09-16 ENCOUNTER — Encounter: Payer: Self-pay | Admitting: Neurology

## 2021-09-16 DIAGNOSIS — Z1231 Encounter for screening mammogram for malignant neoplasm of breast: Secondary | ICD-10-CM | POA: Diagnosis not present

## 2021-09-16 NOTE — Progress Notes (Signed)
Chief Complaint:   OBESITY Stephanie [name] is here to discuss her progress with her obesity treatment plan along with follow-up of her obesity related diagnoses. Shekela is on the Category 2 Plan and keeping a food journal and adhering to recommended goals of 200-300 calories and 20+ grams of protein at breakfast daily and states she is following her eating plan approximately 80% of the time. Zriyah states she is walking for 15 minutes 4 times per week.  Today's visit was #: 14 Starting weight: 228 lbs Starting date: 02/12/2021 Today's weight: 183 lbs Today's date: 09/11/2021 Total lbs lost to date: 45 Total lbs lost since last in-office visit: 4  Interim History: Ahilyn is weighing her protein and eating everything on the plan. She denies hunger or cravings. She started walking some as well and she feels great. She has no issues with the plan.  Subjective:   1. Type 2 diabetes mellitus with other specified complication, without long-term current use of insulin (HCC) Lajuana's blood sugars run at 107, and around there. No lows or concerns. She has been at 0.25 mg of Ozempic for 1 week now, and she is tolerating it well, and notes it works well.  2. Vitamin D deficiency SUA MILSON is tolerating medication(s) well without side effects. Medication compliance is good and patient appears to be taking it as prescribed. Denies additional concerns regarding this condition.   3. Nonintractable headache, unspecified chronicity pattern, unspecified headache type Floyd notes some improvement. She is seeing Dr. Jaynee Eagles. She got an MRI yesterday and she is waiting to hear back. She is very happy with the care she has gotten.  Assessment/Plan:  No orders of the defined types were placed in this encounter.   Medications Discontinued During This Encounter  Medication Reason   predniSONE (DELTASONE) 20 MG tablet    topiramate (TOPAMAX) 50 MG tablet    Semaglutide,0.25 or 0.5MG /DOS, (OZEMPIC, 0.25 OR 0.5  MG/DOSE,) 2 MG/1.5ML SOPN Reorder     Meds ordered this encounter  Medications   Semaglutide,0.25 or 0.5MG /DOS, (OZEMPIC, 0.25 OR 0.5 MG/DOSE,) 2 MG/1.5ML SOPN    Sig: Inject 0.25 mg into the skin once a week.    Dispense:  1.5 mL    Refill:  0    30 d supply;  ** OV for RF **   Do not send RF request     1. Type 2 diabetes mellitus with other specified complication, without long-term current use of insulin (HCC) We will refill Ozempic 0.25 mg weekly for 1 month. [place] declines a need for dose increase. Good blood sugar control is important to decrease the likelihood of diabetic complications such as nephropathy, neuropathy, limb loss, blindness, coronary artery disease, and death. Intensive lifestyle modification including diet, exercise and weight loss are the first line of treatment for diabetes.   - Semaglutide,0.25 or 0.5MG /DOS, (OZEMPIC, 0.25 OR 0.5 MG/DOSE,) 2 MG/1.5ML SOPN; Inject 0.25 mg into the skin once a week.  Dispense: 1.5 mL; Refill: 0  2. Vitamin D deficiency Sherlyn will continue prescription Vitamin D 50,000 IU every 10 days. She will follow-up for routine testing of Vitamin D, at least 2-3 times per year to avoid over-replacement.  3. Nonintractable headache, unspecified chronicity pattern, unspecified headache type Zayah will continue her prudent nutritional plan, proper hydration, and walk slowly as tolerated. She will continue to follow up with Neurology in regards to headache management.  4. Obesity with current BMI of 38.4 Sway is currently in the action stage  of change. As such, her goal is to continue with weight loss efforts. She has agreed to the Category 2 Plan and keeping a food journal and adhering to recommended goals of 200-300 calories and 20+ grams of protein at breakfast daily.   Exercise goals: Older adults should follow the adult guidelines. When older adults cannot meet the adult guidelines, they should be as physically active as their abilities and  conditions will allow.   Behavioral modification strategies: avoiding temptations and planning for success.  Dashelle has agreed to follow-up with our clinic in 2 to 3 weeks. She was informed of the importance of frequent follow-up visits to maximize her success with intensive lifestyle modifications for her multiple health conditions.   Objective:   Blood pressure (!) 141/83, pulse 63, temperature 98.1 F (36.7 C), height 4\' 10"  (1.473 m), weight 184 lb (83.5 kg), SpO2 96 %. Body mass index is 38.46 kg/m.  General: Cooperative, alert, well developed, in no acute distress. HEENT: Conjunctivae and lids unremarkable. Cardiovascular: Regular rhythm.  Lungs: Normal work of breathing. Neurologic: No focal deficits.   Lab Results  Component Value Date   CREATININE 0.84 08/01/2021   BUN 22 08/01/2021   NA 143 08/01/2021   K 4.1 08/01/2021   CL 98 08/01/2021   CO2 26 08/01/2021   Lab Results  Component Value Date   ALT 20 08/01/2021   AST 20 08/01/2021   ALKPHOS 53 08/01/2021   BILITOT 0.4 08/01/2021   Lab Results  Component Value Date   HGBA1C 5.4 08/01/2021   HGBA1C 5.5 04/24/2021   HGBA1C 6.5 12/06/2020   HGBA1C 4.9 07/19/2018   Lab Results  Component Value Date   INSULIN 13.0 08/01/2021   INSULIN 15.6 02/12/2021   Lab Results  Component Value Date   TSH 1.740 09/06/2021   Lab Results  Component Value Date   CHOL 176 08/01/2021   HDL 67 08/01/2021   LDLCALC 73 08/01/2021   TRIG 224 (H) 08/01/2021   Lab Results  Component Value Date   VD25OH 72.0 08/01/2021   VD25OH 68.6 04/24/2021   VD25OH 39.4 12/06/2020   Lab Results  Component Value Date   WBC 5.3 02/12/2021   HGB 12.4 02/12/2021   HCT 37.3 02/12/2021   MCV 92 02/12/2021   PLT 256 02/12/2021   No results found for: IRON, TIBC, FERRITIN  Obesity Behavioral Intervention:   Approximately 15 minutes were spent on the discussion below.  ASK: We discussed the diagnosis of obesity with [name] today  and Carmindy agreed to give [name] permission to discuss obesity behavioral modification therapy today.  ASSESS: Mikkayla has the diagnosis of obesity and her BMI today is 38.4. Lavett is in the action stage of change.   ADVISE: Avalyn was educated on the multiple health risks of obesity as well as the benefit of weight loss to improve her health. She was advised of the need for long term treatment and the importance of lifestyle modifications to improve her current health and to decrease her risk of future health problems.  AGREE: Multiple dietary modification options and treatment options were discussed and Noreene agreed to follow the recommendations documented in the above note.  ARRANGE: Ashae was educated on the importance of frequent visits to treat obesity as outlined per CMS and USPSTF guidelines and agreed to schedule her next follow up appointment today.  Attestation Statements:   Reviewed by clinician on day of visit: allergies, medications, problem list, medical history, surgical history, family history, social  history, and previous encounter notes.   Wilhemena Durie, am acting as transcriptionist for [employer], DO.  I have reviewed the above documentation for accuracy and completeness, and I agree with the above. Marjory Sneddon, D.O.  The [place] was signed into law in 2016 which includes the topic of electronic health records.  This provides immediate access to information in MyChart.  This includes consultation notes, operative notes, office notes, lab results and pathology reports.  If you have any questions about what you read please let us know at your next visit so we can discuss your concerns and take corrective action if need be.  We are right here with you.

## 2021-09-18 ENCOUNTER — Other Ambulatory Visit (INDEPENDENT_AMBULATORY_CARE_PROVIDER_SITE_OTHER): Payer: Self-pay | Admitting: Family Medicine

## 2021-09-18 DIAGNOSIS — E559 Vitamin D deficiency, unspecified: Secondary | ICD-10-CM

## 2021-09-18 NOTE — Telephone Encounter (Signed)
Dr.Opalski ?

## 2021-09-22 ENCOUNTER — Other Ambulatory Visit: Payer: Self-pay | Admitting: Neurology

## 2021-09-22 ENCOUNTER — Telehealth: Payer: Self-pay | Admitting: Neurology

## 2021-09-22 DIAGNOSIS — G43711 Chronic migraine without aura, intractable, with status migrainosus: Secondary | ICD-10-CM

## 2021-09-22 MED ORDER — AJOVY 225 MG/1.5ML ~~LOC~~ SOAJ: 225.0000 mg | SUBCUTANEOUS | 11 refills | Status: DC

## 2021-09-22 MED ORDER — RIZATRIPTAN BENZOATE 10 MG PO TBDP: 10.0000 mg | ORAL_TABLET | ORAL | 11 refills | Status: DC | PRN

## 2021-09-22 NOTE — Telephone Encounter (Signed)
Can you cal and make her a video follow up with me in the next few weeks? If I don;t have anything I can make an add on end of day let m eknow thanks

## 2021-09-23 ENCOUNTER — Other Ambulatory Visit: Payer: Self-pay | Admitting: Neurology

## 2021-09-23 ENCOUNTER — Telehealth: Payer: Self-pay

## 2021-09-23 DIAGNOSIS — G43711 Chronic migraine without aura, intractable, with status migrainosus: Secondary | ICD-10-CM

## 2021-09-23 NOTE — Telephone Encounter (Signed)
I have submitted a PA request for Ajovy on CMM, Key: [id number] - PA Case ID: [id number]. Awaiting determination.

## 2021-09-23 NOTE — Telephone Encounter (Signed)
Right now the first open spot is the morning of March 1. She also has an office visit scheduled for March 28.

## 2021-09-24 ENCOUNTER — Other Ambulatory Visit: Payer: Self-pay | Admitting: Neurology

## 2021-09-24 DIAGNOSIS — G43711 Chronic migraine without aura, intractable, with status migrainosus: Secondary | ICD-10-CM

## 2021-09-24 NOTE — Telephone Encounter (Signed)
I called pt and LMVM for her that ajovy not approved, change to emaglity if ok (per Dr Lucia Gaskins).  Call back after 1300.

## 2021-09-24 NOTE — Telephone Encounter (Signed)
Ajovy autoinjector is denied because it is not on your plan's Drug List (formulary). Medication authorization requires the following: (1) You need to try two (2) of these covered drugs: (a) Aimovig*. (b) Emgality auto-injector solution*. (2) OR your doctor needs to give [name] specific medical reasons why two (2) of the covered drug(s) are not appropriate for you. Reviewed by: Gaylyn Rong, R.Ph. *Please note: Covered drug(s) may require prior authorization.  Would you like to change medication or proceed with an appeal?

## 2021-09-24 NOTE — Telephone Encounter (Signed)
Waiting for pt response about switching therapy.

## 2021-09-25 MED ORDER — EMGALITY 120 MG/ML ~~LOC~~ SOAJ: 120.0000 mg | SUBCUTANEOUS | 11 refills | Status: DC

## 2021-09-25 NOTE — Addendum Note (Signed)
Addended by: Bertram Savin on: 09/25/2021 12:58 PM   Modules accepted: Orders

## 2021-09-25 NOTE — Telephone Encounter (Signed)
Called pt again and LVM (ok per DPR) advising Ajovy not approved by insurance however Emgality is preferred. Dr Lucia Gaskins plans to order [facility] instead but wants to make sure patient is ok with this. Emgality is very similar to Ajovy and is just as good. Please call back or respond to the [place] message. Left office number in message.

## 2021-09-26 ENCOUNTER — Telehealth: Payer: Self-pay | Admitting: Neurology

## 2021-09-26 NOTE — Telephone Encounter (Signed)
PA completed on CMM/ optum RX KEY: [id number] Will await determination

## 2021-09-26 NOTE — Telephone Encounter (Signed)
PA approved  Request Reference Number: [id number]. EMGALITY INJ 120MG /ML is approved through 08/17/2022. Your patient may now fill this prescription and it will be covered.

## 2021-10-07 ENCOUNTER — Ambulatory Visit (INDEPENDENT_AMBULATORY_CARE_PROVIDER_SITE_OTHER): Payer: Medicare Other | Admitting: Family Medicine

## 2021-10-07 ENCOUNTER — Other Ambulatory Visit: Payer: Self-pay

## 2021-10-07 ENCOUNTER — Encounter (INDEPENDENT_AMBULATORY_CARE_PROVIDER_SITE_OTHER): Payer: Self-pay | Admitting: Family Medicine

## 2021-10-07 VITALS — BP 125/78 | HR 62 | Temp 97.5°F | Ht <= 58 in | Wt 180.0 lb

## 2021-10-07 DIAGNOSIS — E669 Obesity, unspecified: Secondary | ICD-10-CM | POA: Diagnosis not present

## 2021-10-07 DIAGNOSIS — Z6837 Body mass index (BMI) 37.0-37.9, adult: Secondary | ICD-10-CM

## 2021-10-07 DIAGNOSIS — I10 Essential (primary) hypertension: Secondary | ICD-10-CM | POA: Diagnosis not present

## 2021-10-07 DIAGNOSIS — E1169 Type 2 diabetes mellitus with other specified complication: Secondary | ICD-10-CM

## 2021-10-07 DIAGNOSIS — G43809 Other migraine, not intractable, without status migrainosus: Secondary | ICD-10-CM

## 2021-10-07 DIAGNOSIS — E559 Vitamin D deficiency, unspecified: Secondary | ICD-10-CM

## 2021-10-07 MED ORDER — VITAMIN D (ERGOCALCIFEROL) 1.25 MG (50000 UNIT) PO CAPS: ORAL_CAPSULE | ORAL | 0 refills | Status: DC

## 2021-10-07 MED ORDER — OZEMPIC (0.25 OR 0.5 MG/DOSE) 2 MG/1.5ML ~~LOC~~ SOPN: 0.2500 mg | PEN_INJECTOR | SUBCUTANEOUS | 0 refills | Status: DC

## 2021-10-07 NOTE — Progress Notes (Signed)
Chief Complaint:   OBESITY Stephanie [name] is here to discuss her progress with her obesity treatment plan along with follow-up of her obesity related diagnoses. Stephanie [name] is on the Category 2 Plan and keeping a food journal and adhering to recommended goals of 200-300 calories and 20+ grams protein with breakfast and states she is following her eating plan approximately 75% of the time. Stephanie [name] states she is walking 10 minutes 5-7 times per week.  Today's visit was #: 15 Starting weight: 228 lbs Starting date: 02/12/2021 Today's weight: 180 lbs Today's date: 10/07/2021 Total lbs lost to date: 48 Total lbs lost since last in-office visit: 3  Interim History: Stephanie [name] is here for a follow up office visit.  She continues to do great.  We reviewed her meal plan and questions were answered.  Patient's food recall appears to be accurate and consistent with what is on plan when she is following it.   When eating on plan, her hunger and cravings are well controlled.    Pt's snacks on skinny girl popcorn or sugar free pudding.  Subjective:   1. Other migraine without status migrainosus, not intractable Headaches are much better controlled. Neurology put pt on Emgality and Maxalt prn. She hasn't used Maxalt or needed it.  2. Type 2 diabetes mellitus with other specified complication, without long-term current use of insulin (HCC) Pt checks FBS every 3 days. Her highest read was 111 and levels usually run 100-111. Pt denies hunger or cravings.  3. Essential hypertension BP is well controlled at home- 120-130's/70-80's.  4. Vitamin D deficiency Stephanie [name] is tolerating medication(s) well without side effects.  Medication compliance is good and patient appears to be taking it as prescribed.  The patient denies additional concerns regarding this condition.   Assessment/Plan:  No orders of the defined types were placed in this encounter.   Medications Discontinued During This Encounter  Medication Reason    Vitamin D, Ergocalciferol, (DRISDOL) 1.25 MG (50000 UNIT) CAPS capsule Reorder   Semaglutide,0.25 or 0.5MG /DOS, (OZEMPIC, 0.25 OR 0.5 MG/DOSE,) 2 MG/1.5ML SOPN Reorder     Meds ordered this encounter  Medications   Vitamin D, Ergocalciferol, (DRISDOL) 1.25 MG (50000 UNIT) CAPS capsule    Sig: 1 po q 10d    Dispense:  3 capsule    Refill:  0    Ov for RF; 30 d supply   Semaglutide,0.25 or 0.5MG /DOS, (OZEMPIC, 0.25 OR 0.5 MG/DOSE,) 2 MG/1.5ML SOPN    Sig: Inject 0.25 mg into the skin once a week.    Dispense:  1.5 mL    Refill:  0    30 d supply;  ** OV for RF **   Do not send RF request     1. Other migraine without status migrainosus, not intractable Continue medication management, per neurology. Increase water intake and increase exercise.  2. Type 2 diabetes mellitus with other specified complication, without long-term current use of insulin (HCC) Good blood sugar control is important to decrease the likelihood of diabetic complications such as nephropathy, neuropathy, limb loss, blindness, coronary artery disease, and death. Intensive lifestyle modification including diet, exercise and weight loss are the first line of treatment for diabetes. Continue Ozempic and Metformin. Check labs at next OV.  Refill- Semaglutide,0.25 or 0.5MG /DOS, (OZEMPIC, 0.25 OR 0.5 MG/DOSE,) 2 MG/1.5ML SOPN; Inject 0.25 mg into the skin once a week.  Dispense: 1.5 mL; Refill: 0  3. Essential hypertension At goal. Stephanie [name] is working on healthy weight loss  and exercise to improve blood pressure control. We will watch for signs of hypotension as she continues her lifestyle modifications.  4. Vitamin D deficiency Low Vitamin D level contributes to fatigue and are associated with obesity, breast, and colon cancer. She agrees to continue to take prescription Vitamin D 50,000 IU every 10 days and will follow-up for routine testing of Vitamin D, at least 2-3 times per year to avoid over-replacement.  Refill-  Vitamin D, Ergocalciferol, (DRISDOL) 1.25 MG (50000 UNIT) CAPS capsule; 1 po q 10d  Dispense: 3 capsule; Refill: 0  5. Obesity with current BMI of 37.7 Stephanie [name] is currently in the action stage of change. As such, her goal is to continue with weight loss efforts. She has agreed to the Category 2 Plan and keeping a food journal and adhering to recommended goals of 200-300 calories and 20+ grams protein at breakfast.   Exercise goals:  Increase as tolerated.  Behavioral modification strategies: increasing lean protein intake.  Stephanie [name] has agreed to follow-up with our clinic in 3 weeks. She was informed of the importance of frequent follow-up visits to maximize her success with intensive lifestyle modifications for her multiple health conditions.   Objective:   Blood pressure 125/78, pulse 62, temperature (!) 97.5 F (36.4 C), temperature source Oral, height 4\' 10"  (1.473 m), weight 180 lb (81.6 kg), SpO2 95 %. Body mass index is 37.62 kg/m.  General: Cooperative, alert, well developed, in no acute distress. HEENT: Conjunctivae and lids unremarkable. Cardiovascular: Regular rhythm.  Lungs: Normal work of breathing. Neurologic: No focal deficits.   Lab Results  Component Value Date   CREATININE 0.84 08/01/2021   BUN 22 08/01/2021   NA 143 08/01/2021   K 4.1 08/01/2021   CL 98 08/01/2021   CO2 26 08/01/2021   Lab Results  Component Value Date   ALT 20 08/01/2021   AST 20 08/01/2021   ALKPHOS 53 08/01/2021   BILITOT 0.4 08/01/2021   Lab Results  Component Value Date   HGBA1C 5.4 08/01/2021   HGBA1C 5.5 04/24/2021   HGBA1C 6.5 12/06/2020   HGBA1C 4.9 07/19/2018   Lab Results  Component Value Date   INSULIN 13.0 08/01/2021   INSULIN 15.6 02/12/2021   Lab Results  Component Value Date   TSH 1.740 09/06/2021   Lab Results  Component Value Date   CHOL 176 08/01/2021   HDL 67 08/01/2021   LDLCALC 73 08/01/2021   TRIG 224 (H) 08/01/2021   Lab Results  Component Value  Date   VD25OH 72.0 08/01/2021   VD25OH 68.6 04/24/2021   VD25OH 39.4 12/06/2020   Lab Results  Component Value Date   WBC 5.3 02/12/2021   HGB 12.4 02/12/2021   HCT 37.3 02/12/2021   MCV 92 02/12/2021   PLT 256 02/12/2021   No results found for: IRON, TIBC, FERRITIN  Obesity Behavioral Intervention:   Approximately 15 minutes were spent on the discussion below.  ASK: We discussed the diagnosis of obesity with 02/14/2021 today and Stephanie [name] agreed to give Stephanie [name] permission to discuss obesity behavioral modification therapy today.  ASSESS: Stephanie [name] has the diagnosis of obesity and her BMI today is 37.7. Stephanie [name] is in the action stage of change.   ADVISE: Stephanie [name] was educated on the multiple health risks of obesity as well as the benefit of weight loss to improve her health. She was advised of the need for long term treatment and the importance of lifestyle modifications to improve her current health and to decrease her  risk of future health problems.  AGREE: Multiple dietary modification options and treatment options were discussed and Stephanie [name] agreed to follow the recommendations documented in the above note.  ARRANGE: Maisey was educated on the importance of frequent visits to treat obesity as outlined per CMS and USPSTF guidelines and agreed to schedule her next follow up appointment today.  Attestation Statements:   Reviewed by clinician on day of visit: allergies, medications, problem list, medical history, surgical history, family history, social history, and previous encounter notes.  Stephanie [name], CMA, am acting as transcriptionist for [employer], DO.  I have reviewed the above documentation for accuracy and completeness, and I agree with the above. Stephanie [name], D.O.  The 21st Century Cures Act was signed into law in 2016 which includes the topic of electronic health records.  This provides immediate access to information in MyChart.  This includes consultation notes,  operative notes, office notes, lab results and pathology reports.  If you have any questions about what you read please let us know at your next visit so we can discuss your concerns and take corrective action if need be.  We are right here with you.

## 2021-10-15 DIAGNOSIS — R0981 Nasal congestion: Secondary | ICD-10-CM | POA: Diagnosis not present

## 2021-10-15 DIAGNOSIS — J069 Acute upper respiratory infection, unspecified: Secondary | ICD-10-CM | POA: Diagnosis not present

## 2021-10-15 DIAGNOSIS — R051 Acute cough: Secondary | ICD-10-CM | POA: Diagnosis not present

## 2021-10-15 DIAGNOSIS — R059 Cough, unspecified: Secondary | ICD-10-CM | POA: Diagnosis not present

## 2021-10-15 DIAGNOSIS — Z03818 Encounter for observation for suspected exposure to other biological agents ruled out: Secondary | ICD-10-CM | POA: Diagnosis not present

## 2021-10-21 ENCOUNTER — Ambulatory Visit (INDEPENDENT_AMBULATORY_CARE_PROVIDER_SITE_OTHER): Payer: Medicare Other | Admitting: Family Medicine

## 2021-11-04 ENCOUNTER — Other Ambulatory Visit: Payer: Self-pay

## 2021-11-04 ENCOUNTER — Ambulatory Visit (INDEPENDENT_AMBULATORY_CARE_PROVIDER_SITE_OTHER): Payer: Medicare Other | Admitting: Family Medicine

## 2021-11-04 ENCOUNTER — Encounter (INDEPENDENT_AMBULATORY_CARE_PROVIDER_SITE_OTHER): Payer: Self-pay | Admitting: Family Medicine

## 2021-11-04 VITALS — BP 124/66 | HR 60 | Temp 98.0°F | Ht <= 58 in | Wt 180.0 lb

## 2021-11-04 DIAGNOSIS — E1169 Type 2 diabetes mellitus with other specified complication: Secondary | ICD-10-CM

## 2021-11-04 DIAGNOSIS — Z7984 Long term (current) use of oral hypoglycemic drugs: Secondary | ICD-10-CM

## 2021-11-04 DIAGNOSIS — E559 Vitamin D deficiency, unspecified: Secondary | ICD-10-CM | POA: Diagnosis not present

## 2021-11-04 DIAGNOSIS — Z6837 Body mass index (BMI) 37.0-37.9, adult: Secondary | ICD-10-CM | POA: Diagnosis not present

## 2021-11-04 DIAGNOSIS — E669 Obesity, unspecified: Secondary | ICD-10-CM | POA: Diagnosis not present

## 2021-11-04 DIAGNOSIS — E538 Deficiency of other specified B group vitamins: Secondary | ICD-10-CM

## 2021-11-04 MED ORDER — VITAMIN D (ERGOCALCIFEROL) 1.25 MG (50000 UNIT) PO CAPS: ORAL_CAPSULE | ORAL | 0 refills | Status: DC

## 2021-11-04 MED ORDER — OZEMPIC (0.25 OR 0.5 MG/DOSE) 2 MG/1.5ML ~~LOC~~ SOPN: 0.2500 mg | PEN_INJECTOR | SUBCUTANEOUS | 0 refills | Status: DC

## 2021-11-05 ENCOUNTER — Other Ambulatory Visit (INDEPENDENT_AMBULATORY_CARE_PROVIDER_SITE_OTHER): Payer: Self-pay | Admitting: Family Medicine

## 2021-11-05 DIAGNOSIS — E559 Vitamin D deficiency, unspecified: Secondary | ICD-10-CM

## 2021-11-10 NOTE — Progress Notes (Signed)
? ? ? ?Chief Complaint:  ? ?OBESITY ?Stephanie [name] is here to discuss her progress with her obesity treatment plan along with follow-up of her obesity related diagnoses. Stephanie [name] is on the Category 2 Plan and keeping a food journal and adhering to recommended goals of 200-300 calories and 20+ grams of protein at breakfast daily and states she is following her eating plan approximately 20% of the time. Stephanie [name] states she is not currently exercising. ? ?Today's visit was #: 16 ?Starting weight: 228 lbs ?Starting date: 02/12/2021 ?Today's weight: 180 lbs ?Today's date: 11/04/2021 ?Total lbs lost to date: 48 ?Total lbs lost since last in-office visit: 0 ? ?Interim History: Stephanie [name] has been sick with an upper respiratory infection/bronchitis. She was placed on steroids and cough syrup. She is not eating enough and is skipping meals, and unable to walk/exercise as well. ? ?Subjective:  ? ?1. Type 2 diabetes mellitus with other specified complication, without long-term current use of insulin (HCC) ?Stephanie [name] is on Ozempic and metformin. Last A1c 3 months ago was 5.4. She is on prednisone. Fasting blood sugars occasionally in 160's and usually in 90-100's only. ? ?2. B12 deficiency ?Stephanie [name] is on B12 supplementation 500 mcg daily. ? ?3. Vitamin D deficiency ?We decreased her dose approximately 3 months ago. Stephanie [name] is tolerating it well with good compliance. ? ?Assessment/Plan:  ? ?Orders Placed This Encounter  ?Procedures  ? Insulin, random  ? Hemoglobin A1c  ? Comprehensive metabolic panel  ? Lipid Panel With LDL/HDL Ratio  ? Vitamin B12  ? VITAMIN D 25 Hydroxy (Vit-D Deficiency, Fractures)  ? ? ?Medications Discontinued During This Encounter  ?Medication Reason  ? Fremanezumab-vfrm (AJOVY) 225 MG/1.5ML SOAJ   ? Ubrogepant (UBRELVY) 100 MG TABS   ? Vitamin D, Ergocalciferol, (DRISDOL) 1.25 MG (50000 UNIT) CAPS capsule Reorder  ? Semaglutide,0.25 or 0.5MG /DOS, (OZEMPIC, 0.25 OR 0.5 MG/DOSE,) 2 MG/1.5ML SOPN Reorder  ?  ? ?Meds ordered this  encounter  ?Medications  ? Semaglutide,0.25 or 0.5MG /DOS, (OZEMPIC, 0.25 OR 0.5 MG/DOSE,) 2 MG/1.5ML SOPN  ?  Sig: Inject 0.25 mg into the skin once a week.  ?  Dispense:  1.5 mL  ?  Refill:  0  ?  30 d supply;  ** OV for RF **   Do not send RF request  ? Vitamin D, Ergocalciferol, (DRISDOL) 1.25 MG (50000 UNIT) CAPS capsule  ?  Sig: 1 po q 10d  ?  Dispense:  3 capsule  ?  Refill:  0  ?  Ov for RF; 30 d supply  ?  ? ?1. Type 2 diabetes mellitus with other specified complication, without long-term current use of insulin (HCC) ?We will refill Ozempic 0.25 mg weekly for 1 month, (she declines increase in dose). We will recheck labs at her next office visit. ? ?- Semaglutide,0.25 or 0.5MG /DOS, (OZEMPIC, 0.25 OR 0.5 MG/DOSE,) 2 MG/1.5ML SOPN; Inject 0.25 mg into the skin once a week.  Dispense: 1.5 mL; Refill: 0 ?- Insulin, random ?- Hemoglobin A1c ?- Comprehensive metabolic panel ?- Lipid Panel With LDL/HDL Ratio ? ?2. B12 deficiency ?Stephanie [name] will continue her B12 supplementations, and we will recheck labs at her next office visit. ? ?- Vitamin B12 ? ?3. Vitamin D deficiency ?We will refill Ergo for 1 month, and we will recheck labs at  [facility] next office visit. ? ?- Vitamin D, Ergocalciferol, (DRISDOL) 1.25 MG (50000 UNIT) CAPS capsule; 1 po q 10d  Dispense: 3 capsule; Refill: 0 ?- VITAMIN D 25 Hydroxy (Vit-D Deficiency, Fractures) ? ?  4. Obesity with current BMI of 37.6 ?Stephanie [name] is currently in the action stage of change. As such, her goal is to continue with weight loss efforts. She has agreed to the Category 2 Plan and keeping a food journal and adhering to recommended goals of 200-300 calories and 20+ grams of protein at breakfast daily.  ? ?We will repeat fasting labs at her next office visit. ? ?Exercise goals: As is. ? ?Behavioral modification strategies: increasing water intake, no skipping meals, and keeping healthy foods in the home. ? ?Stephanie [name] has agreed to follow-up with our clinic in 3 weeks. She was informed of  the importance of frequent follow-up visits to maximize her success with intensive lifestyle modifications for her multiple health conditions.  ? ?Objective:  ? ?Blood pressure 124/66, pulse 60, temperature 98 ?F (36.7 ?C), height 4\' 10"  (1.473 m), weight 180 lb (81.6 kg), SpO2 98 %. ?Body mass index is 37.62 kg/m?. ? ?General: Cooperative, alert, well developed, in no acute distress. ?HEENT: Conjunctivae and lids unremarkable. ?Cardiovascular: Regular rhythm.  ?Lungs: Normal work of breathing. ?Neurologic: No focal deficits.  ? ?Lab Results  ?Component Value Date  ? CREATININE 0.84 08/01/2021  ? BUN 22 08/01/2021  ? NA 143 08/01/2021  ? K 4.1 08/01/2021  ? CL 98 08/01/2021  ? CO2 26 08/01/2021  ? ?Lab Results  ?Component Value Date  ? ALT 20 08/01/2021  ? AST 20 08/01/2021  ? ALKPHOS 53 08/01/2021  ? BILITOT 0.4 08/01/2021  ? ?Lab Results  ?Component Value Date  ? HGBA1C 5.4 08/01/2021  ? HGBA1C 5.5 04/24/2021  ? HGBA1C 6.5 12/06/2020  ? HGBA1C 4.9 07/19/2018  ? ?Lab Results  ?Component Value Date  ? INSULIN 13.0 08/01/2021  ? INSULIN 15.6 02/12/2021  ? ?Lab Results  ?Component Value Date  ? TSH 1.740 09/06/2021  ? ?Lab Results  ?Component Value Date  ? CHOL 176 08/01/2021  ? HDL 67 08/01/2021  ? LDLCALC 73 08/01/2021  ? TRIG 224 (H) 08/01/2021  ? ?Lab Results  ?Component Value Date  ? VD25OH 72.0 08/01/2021  ? VD25OH 68.6 04/24/2021  ? VD25OH 39.4 12/06/2020  ? ?Lab Results  ?Component Value Date  ? WBC 5.3 02/12/2021  ? HGB 12.4 02/12/2021  ? HCT 37.3 02/12/2021  ? MCV 92 02/12/2021  ? PLT 256 02/12/2021  ? ?No results found for: IRON, TIBC, FERRITIN ? ?Obesity Behavioral Intervention:  ? ?Approximately 15 minutes were spent on the discussion below. ? ?ASK: ?We discussed the diagnosis of obesity with 02/14/2021 today and Stephanie [name] agreed to give Stephanie [name] permission to discuss obesity behavioral modification therapy today. ? ?ASSESS: ?Stephanie [name] has the diagnosis of obesity and her BMI today is 37.6. Stephanie [name] is in the action stage of  change.  ? ?ADVISE: ?Stephanie [name] was educated on the multiple health risks of obesity as well as the benefit of weight loss to improve her health. She was advised of the need for long term treatment and the importance of lifestyle modifications to improve her current health and to decrease her risk of future health problems. ? ?AGREE: ?Multiple dietary modification options and treatment options were discussed and Stephanie [name] agreed to follow the recommendations documented in the above note. ? ?ARRANGE: ?Stephanie [name] was educated on the importance of frequent visits to treat obesity as outlined per CMS and USPSTF guidelines and agreed to schedule her next follow up appointment today. ? ?Attestation Statements:  ? ?Reviewed by clinician on day of visit: allergies, medications, problem list, medical history, surgical history, family  history, social history, and previous encounter notes. ? ? ?I, Trixie Dredge, am acting as transcriptionist for [employer], DO. ? ?I have reviewed the above documentation for accuracy and completeness, and I agree with the above. Marjory Sneddon, D.O. ? ?The [place] was signed into law in 2016 which includes the topic of electronic health records.  This provides immediate access to information in MyChart.  This includes consultation notes, operative notes, office notes, lab results and pathology reports.  If you have any questions about what you read please let us know at your next visit so we can discuss your concerns and take corrective action if need be.  We are right here with you. ? ? ?

## 2021-11-12 ENCOUNTER — Ambulatory Visit: Payer: Medicare Other | Admitting: Neurology

## 2021-11-19 ENCOUNTER — Ambulatory Visit: Payer: Medicare Other | Admitting: Neurology

## 2021-11-21 ENCOUNTER — Encounter: Payer: Self-pay | Admitting: Neurology

## 2021-11-25 ENCOUNTER — Other Ambulatory Visit: Payer: Self-pay

## 2021-11-25 ENCOUNTER — Encounter (HOSPITAL_BASED_OUTPATIENT_CLINIC_OR_DEPARTMENT_OTHER): Payer: Self-pay

## 2021-11-25 ENCOUNTER — Ambulatory Visit (INDEPENDENT_AMBULATORY_CARE_PROVIDER_SITE_OTHER): Payer: Medicare Other | Admitting: Family Medicine

## 2021-11-25 ENCOUNTER — Encounter (INDEPENDENT_AMBULATORY_CARE_PROVIDER_SITE_OTHER): Payer: Self-pay | Admitting: Family Medicine

## 2021-11-25 VITALS — BP 111/72 | HR 63 | Temp 97.9°F | Ht <= 58 in | Wt 177.0 lb

## 2021-11-25 DIAGNOSIS — J3089 Other allergic rhinitis: Secondary | ICD-10-CM | POA: Diagnosis not present

## 2021-11-25 DIAGNOSIS — E669 Obesity, unspecified: Secondary | ICD-10-CM

## 2021-11-25 DIAGNOSIS — E538 Deficiency of other specified B group vitamins: Secondary | ICD-10-CM | POA: Diagnosis not present

## 2021-11-25 DIAGNOSIS — Z7982 Long term (current) use of aspirin: Secondary | ICD-10-CM | POA: Insufficient documentation

## 2021-11-25 DIAGNOSIS — E1169 Type 2 diabetes mellitus with other specified complication: Secondary | ICD-10-CM

## 2021-11-25 DIAGNOSIS — L309 Dermatitis, unspecified: Secondary | ICD-10-CM | POA: Diagnosis not present

## 2021-11-25 DIAGNOSIS — Z6841 Body Mass Index (BMI) 40.0 and over, adult: Secondary | ICD-10-CM

## 2021-11-25 DIAGNOSIS — Z7984 Long term (current) use of oral hypoglycemic drugs: Secondary | ICD-10-CM | POA: Diagnosis not present

## 2021-11-25 DIAGNOSIS — E559 Vitamin D deficiency, unspecified: Secondary | ICD-10-CM

## 2021-11-25 DIAGNOSIS — Z6837 Body mass index (BMI) 37.0-37.9, adult: Secondary | ICD-10-CM | POA: Diagnosis not present

## 2021-11-25 DIAGNOSIS — M7989 Other specified soft tissue disorders: Secondary | ICD-10-CM | POA: Diagnosis present

## 2021-11-25 DIAGNOSIS — L509 Urticaria, unspecified: Secondary | ICD-10-CM | POA: Insufficient documentation

## 2021-11-25 MED ORDER — MONTELUKAST SODIUM 10 MG PO TABS: 10.0000 mg | ORAL_TABLET | Freq: Every day | ORAL | 0 refills | Status: DC

## 2021-11-25 NOTE — ED Triage Notes (Signed)
Patient states she had labwork drawn this morning to [facility].  Noticed an area of swelling proximal to the site was red and swollen.  She is concerned due to pmh of blood clots.   Patient noted to be hard of hearing in triage.  Denies taking blood thinning medications, but does take a daily baby aspirin. ?

## 2021-11-26 ENCOUNTER — Emergency Department (HOSPITAL_BASED_OUTPATIENT_CLINIC_OR_DEPARTMENT_OTHER)
Admission: EM | Admit: 2021-11-26 | Discharge: 2021-11-26 | Disposition: A | Discharge status: Home / Self Care | Payer: Medicare Other | Attending: Emergency Medicine | Admitting: Emergency Medicine

## 2021-11-26 DIAGNOSIS — L309 Dermatitis, unspecified: Secondary | ICD-10-CM

## 2021-11-26 LAB — COMPREHENSIVE METABOLIC PANEL
ALT: 20 IU/L (ref 0–32)
AST: 25 IU/L (ref 0–40)
Albumin/Globulin Ratio: 1.7 (ref 1.2–2.2)
Albumin: 4.5 g/dL (ref 3.8–4.8)
Alkaline Phosphatase: 70 IU/L (ref 44–121)
BUN/Creatinine Ratio: 28 (ref 12–28)
BUN: 21 mg/dL (ref 8–27)
Bilirubin Total: 0.3 mg/dL (ref 0.0–1.2)
CO2: 23 mmol/L (ref 20–29)
Calcium: 10.3 mg/dL (ref 8.7–10.3)
Chloride: 97 mmol/L (ref 96–106)
Creatinine, Ser: 0.75 mg/dL (ref 0.57–1.00)
Globulin, Total: 2.6 g/dL (ref 1.5–4.5)
Glucose: 96 mg/dL (ref 70–99)
Potassium: 4.3 mmol/L (ref 3.5–5.2)
Sodium: 138 mmol/L (ref 134–144)
Total Protein: 7.1 g/dL (ref 6.0–8.5)
eGFR: 87 mL/min/{1.73_m2} (ref >59)

## 2021-11-26 LAB — LIPID PANEL WITH LDL/HDL RATIO
Cholesterol, Total: 171 mg/dL (ref 100–199)
HDL: 63 mg/dL (ref >39)
LDL Chol Calc (NIH): 76 mg/dL (ref 0–99)
LDL/HDL Ratio: 1.2 ratio (ref 0.0–3.2)
Triglycerides: 197 mg/dL — ABNORMAL HIGH (ref 0–149)
VLDL Cholesterol Cal: 32 mg/dL (ref 5–40)

## 2021-11-26 LAB — VITAMIN B12: Vitamin B-12: 1181 pg/mL (ref 232–1245)

## 2021-11-26 LAB — VITAMIN D 25 HYDROXY (VIT D DEFICIENCY, FRACTURES): Vit D, 25-Hydroxy: 62.9 ng/mL (ref 30.0–100.0)

## 2021-11-26 LAB — HEMOGLOBIN A1C
Est. average glucose Bld gHb Est-mCnc: 97 mg/dL
Hgb A1c MFr Bld: 5 % (ref 4.8–5.6)

## 2021-11-26 LAB — INSULIN, RANDOM: INSULIN: 13.2 u[IU]/mL (ref 2.6–24.9)

## 2021-11-26 NOTE — Progress Notes (Signed)
? ? ? ?Chief Complaint:  ? ?OBESITY ?Stephanie [name] is here to discuss her progress with her obesity treatment plan along with follow-up of her obesity related diagnoses. Stephanie [name] is on the Category 2 Plan and keeping a food journal and adhering to recommended goals of 200-300 calories and 20+ grams of protein at breakfast daily and states she is following her eating plan approximately 50% of the time. Stephanie [name] states she is walking for 10 minutes 3-4 times per week.   ? ?Today's visit was #: 17 ?Starting weight: 228 lbs ?Starting date: 02/12/2021 ?Today's weight: 177 lbs ?Today's date: 11/25/2021 ?Total lbs lost to date: 81 ?Total lbs lost since last in-office visit: 3 ? ?Interim History: Stephanie [name] is here for a follow up office visit.  We reviewed her meal plan and all questions were answered.  Patient's food recall appears to be accurate and consistent with what is on plan when she is following it. When eating on plan, her hunger and cravings are well controlled. Stephanie [name] is here to get fasting blood work today.  ? ?Subjective:  ? ?1. Type 2 diabetes mellitus with other specified complication, without long-term current use of insulin (HCC) ?Stephanie [name] denies hunger, but she notes some cravings for salty snacks between lunch and dinner.  ? ?2. Vitamin D deficiency ?Stephanie [name] is tolerating medication(s) well without side effects.  Medication compliance is good as patient endorses taking it as prescribed.  The patient denies additional concerns regarding this condition.     ? ?3. Environmental and seasonal allergies ?Stephanie [name] has caused a flare of her asthma with increased seasonal allergies. She takes Xyzal daily and albuterol daily, and a week or so due to shortness of breath. ? ?Assessment/Plan:  ?No orders of the defined types were placed in this encounter. ? ? ?Medications Discontinued During This Encounter  ?Medication Reason  ? Galcanezumab-gnlm (EMGALITY) 120 MG/ML SOAJ Patient Preference  ? rizatriptan (MAXALT-MLT) 10 MG  disintegrating tablet Patient Preference  ?  ? ?Meds ordered this encounter  ?Medications  ? montelukast (SINGULAIR) 10 MG tablet  ?  Sig: Take 1 tablet (10 mg total) by mouth at bedtime.  ?  Dispense:  30 tablet  ?  Refill:  0  ?  ? ?1. Type 2 diabetes mellitus with other specified complication, without long-term current use of insulin (HCC) ?Stephanie [name] agreed to discontinue 0.25 mg Ozempic weekly and start 0.5 mg Ozempic today. She is due for a shot today.  ? ?2. Vitamin D deficiency ?Stephanie [name] will continue prescription Vitamin D 50,000 IU every 10 days, no refill needed. She will follow-up for routine testing of Vitamin D, at least 2-3 times per year to avoid over-replacement. ? ?3. Environmental and seasonal allergies ?Stephanie [name] agreed to start Singulair, and will continue albuterol as needed and Xyzal qhs as well.  ? ?- montelukast (SINGULAIR) 10 MG tablet; Take 1 tablet (10 mg total) by mouth at bedtime.  Dispense: 30 tablet; Refill: 0 ? ?4. Obesity with current BMI of 37.1 ?Stephanie [name] is currently in the action stage of change. As such, her goal is to continue with weight loss efforts. She has agreed to the Category 2 Plan and keeping a food journal and adhering to recommended goals of 200-300 calories and 20+ grams of protein at breakfast daily.  ? ?Exercise goals: As is.  ? ?Behavioral modification strategies: increasing lean protein intake, decreasing simple carbohydrates, and avoiding temptations. ? ?Stephanie [name] has agreed to follow-up with our clinic in 3 weeks. She was informed  of the importance of frequent follow-up visits to maximize her success with intensive lifestyle modifications for her multiple health conditions.  ? ?Objective:  ? ?Blood pressure 111/72, pulse 63, temperature 97.9 ?F (36.6 ?C), height 4\' 10"  (1.473 m), weight 177 lb (80.3 kg), SpO2 97 %. ?Body mass index is 36.99 kg/m?. ? ?General: Cooperative, alert, well developed, in no acute distress. ?HEENT: Conjunctivae and lids unremarkable. ?Cardiovascular:  Regular rhythm.  ?Lungs: Normal work of breathing. ?Neurologic: No focal deficits.  ? ?Lab Results  ?Component Value Date  ? CREATININE 0.75 11/25/2021  ? BUN 21 11/25/2021  ? NA 138 11/25/2021  ? K 4.3 11/25/2021  ? CL 97 11/25/2021  ? CO2 23 11/25/2021  ? ?Lab Results  ?Component Value Date  ? ALT 20 11/25/2021  ? AST 25 11/25/2021  ? ALKPHOS 70 11/25/2021  ? BILITOT 0.3 11/25/2021  ? ?Lab Results  ?Component Value Date  ? HGBA1C 5.0 11/25/2021  ? HGBA1C 5.4 08/01/2021  ? HGBA1C 5.5 04/24/2021  ? HGBA1C 6.5 12/06/2020  ? HGBA1C 4.9 07/19/2018  ? ?Lab Results  ?Component Value Date  ? INSULIN 13.2 11/25/2021  ? INSULIN 13.0 08/01/2021  ? INSULIN 15.6 02/12/2021  ? ?Lab Results  ?Component Value Date  ? TSH 1.740 09/06/2021  ? ?Lab Results  ?Component Value Date  ? CHOL 171 11/25/2021  ? HDL 63 11/25/2021  ? LDLCALC 76 11/25/2021  ? TRIG 197 (H) 11/25/2021  ? ?Lab Results  ?Component Value Date  ? VD25OH 62.9 11/25/2021  ? VD25OH 72.0 08/01/2021  ? VD25OH 68.6 04/24/2021  ? ?Lab Results  ?Component Value Date  ? WBC 5.3 02/12/2021  ? HGB 12.4 02/12/2021  ? HCT 37.3 02/12/2021  ? MCV 92 02/12/2021  ? PLT 256 02/12/2021  ? ?No results found for: IRON, TIBC, FERRITIN ? ?Obesity Behavioral Intervention:  ? ?Approximately 15 minutes were spent on the discussion below. ? ?ASK: ?We discussed the diagnosis of obesity with 02/14/2021 today and Stephanie [name] agreed to give Stephanie [name] permission to discuss obesity behavioral modification therapy today. ? ?ASSESS: ?Stephanie [name] has the diagnosis of obesity and her BMI today is 37.1. Stephanie [name] is in the action stage of change.  ? ?ADVISE: ?Stephanie [name] was educated on the multiple health risks of obesity as well as the benefit of weight loss to improve her health. She was advised of the need for long term treatment and the importance of lifestyle modifications to improve her current health and to decrease her risk of future health problems. ? ?AGREE: ?Multiple dietary modification options and treatment options were  discussed and Sundy agreed to follow the recommendations documented in the above note. ? ?ARRANGE: ?Maebry was educated on the importance of frequent visits to treat obesity as outlined per CMS and USPSTF guidelines and agreed to schedule her next follow up appointment today. ? ?Attestation Statements:  ? ?Reviewed by clinician on day of visit: allergies, medications, problem list, medical history, surgical history, family history, social history, and previous encounter notes. ? ? ?I, Stephanie [name], am acting as transcriptionist for Burt Knack, DO. ? ?I have reviewed the above documentation for accuracy and completeness, and I agree with the above. [employer], D.O. ? ?The 21st Century Cures Act was signed into law in 2016 which includes the topic of electronic health records.  This provides immediate access to information in MyChart.  This includes consultation notes, operative notes, office notes, lab results and pathology reports.  If you have any questions about what you  read please let us know at your next visit so we can discuss your concerns and take corrective action if need be.  We are right here with you. ? ? ?

## 2021-11-26 NOTE — ED Provider Notes (Signed)
? ?[facility]  ?Provider Note ? ?CSN: [id number] ?Arrival date & time: 11/25/21 2231 ? ?History ?Chief Complaint  ?Patient presents with  ? Arm Swelling  ? ? ?Stephanie [name] is a 71 y.o. female with remote history of blood clot in Leg no longer on anticoagulation had blood drawn by PCP this morning. After she got home, she noticed some discomfort and swelling at the puncture site prompting her to come to the ED for evaluation.  ? ? ?Home Medications ?Prior to Admission medications   ?Medication Sig Start Date End Date Taking? Authorizing Provider  ?acetaminophen (TYLENOL) 500 MG tablet Take 1,000 mg by mouth every 8 (eight) hours as needed.    [provider]  ?albuterol (PROVENTIL HFA;VENTOLIN HFA) 108 (90 BASE) MCG/ACT inhaler Inhale 2 puffs into the lungs every 6 (six) hours as needed for wheezing or shortness of breath.     [provider]  ?aspirin EC 81 MG tablet Take 81 mg by mouth daily.    [provider]  ?atenolol (TENORMIN) 25 MG tablet Take 25 mg by mouth daily.     [provider]  ?fluticasone-salmeterol (ADVAIR) 100-50 MCG/ACT AEPB Inhale 1 puff into the lungs daily.    [provider]  ?hydrochlorothiazide (MICROZIDE) 12.5 MG capsule Take 25 mg by mouth daily.    [provider]  ?ibuprofen (ADVIL) 200 MG tablet Take 400 mg by mouth every 6 (six) hours as needed.    [provider]  ?levocetirizine (XYZAL) 5 MG tablet Take 5 mg by mouth every evening.    [provider]  ?metFORMIN (GLUCOPHAGE) 500 MG tablet Take 500 mg by mouth 2 (two) times daily with a meal.    [provider]  ?montelukast (SINGULAIR) 10 MG tablet Take 1 tablet (10 mg total) by mouth at bedtime. 11/25/21   Mellody Dance, DO  ?omeprazole (PRILOSEC) 40 MG capsule Take 40 mg by mouth daily.    [provider]  ?rosuvastatin (CRESTOR) 5 MG tablet Take 5 mg by mouth daily.    [provider]   ?Semaglutide,0.25 or 0.5MG /DOS, (OZEMPIC, 0.25 OR 0.5 MG/DOSE,) 2 MG/1.5ML SOPN Inject 0.25 mg into the skin once a week. 11/04/21   Mellody Dance, DO  ?sertraline (ZOLOFT) 100 MG tablet Take 100 mg by mouth daily.    [provider]  ?vitamin B-12 (CYANOCOBALAMIN) 500 MCG tablet Take 500 mcg by mouth daily.    [provider]  ?Vitamin D, Ergocalciferol, (DRISDOL) 1.25 MG (50000 UNIT) CAPS capsule 1 po q 10d 11/04/21   Opalski, Neoma Laming, DO  ?zolpidem (AMBIEN) 10 MG tablet Take 10 mg by mouth at bedtime as needed for sleep.    [provider]  ? ? ? ?Allergies    ?Elderberry, Shellfish allergy, Albuterol, Flagyl [metronidazole], and Lisinopril ? ? ?Review of Systems   ?Review of Systems ?Please see HPI for pertinent positives and negatives ? ?Physical Exam ?BP 132/76 (BP Location: Right Arm)   Pulse 60   Temp 98.1 ?F (36.7 ?C) (Oral)   Resp 16   Ht 4\' 10"  (1.473 m)   Wt 80.3 kg   SpO2 98%   BMI 36.99 kg/m?  ? ?Physical Exam ?Vitals and nursing note reviewed.  ?HENT:  ?   Head: Normocephalic.  ?   Nose: Nose normal.  ?Eyes:  ?   Extraocular Movements: Extraocular movements intact.  ?Pulmonary:  ?   Effort: Pulmonary effort is normal.  ?Musculoskeletal:     ?  General: Normal range of motion.  ?   Cervical back: Neck supple.  ?   Comments: Small raised area around the puncture site most consistent with allergic reaction/dermatitis.   ?Skin: ?   Findings: No rash (on exposed skin).  ?Neurological:  ?   Mental Status: She is alert and oriented to person, place, and time.  ?Psychiatric:     ?   Mood and Affect: Mood normal.  ? ? ? ?ED Results / Procedures / Treatments   ?EKG ?None ? ?Procedures ?Procedures ? ?Medications Ordered in the ED ?Medications - No data to display ? ?Initial Impression and Plan ? Patient with skin eruption at the site of phlebotomy earlier today. No signs of bruising, infection or phlebitis. Suspect allergic reaction, likely to the antiseptic given the  distribution. Recommend benadryl and topical steroids. RTED if there is any worsening. PCP Follow up. Caution and subsequent blood draws.  ? ?ED Course  ? ?  ? ? ?MDM Rules/Calculators/A&P ?Medical Decision Making ?Problems Addressed: ?Dermatitis: acute illness or injury ? ?Risk ?OTC drugs. ? ? ? ?Final Clinical Impression(s) / ED Diagnoses ?Final diagnoses:  ?Dermatitis  ? ? ?Rx / DC Orders ?ED Discharge Orders   ? ? None  ? ?  ? ?  ?Truddie Hidden, MD ?11/26/21 [phone] ? ?

## 2021-11-26 NOTE — ED Notes (Signed)
Patient discharged to home.  All discharge instructions reviewed.  Patient verbalized understanding via teachback method.  VS WDL.  Respirations even and unlabored.  Ambulatory out of ED.   °

## 2021-11-30 ENCOUNTER — Other Ambulatory Visit (INDEPENDENT_AMBULATORY_CARE_PROVIDER_SITE_OTHER): Payer: Self-pay | Admitting: Family Medicine

## 2021-11-30 DIAGNOSIS — E559 Vitamin D deficiency, unspecified: Secondary | ICD-10-CM

## 2021-12-03 ENCOUNTER — Other Ambulatory Visit (INDEPENDENT_AMBULATORY_CARE_PROVIDER_SITE_OTHER): Payer: Self-pay | Admitting: Family Medicine

## 2021-12-03 DIAGNOSIS — E559 Vitamin D deficiency, unspecified: Secondary | ICD-10-CM

## 2021-12-17 ENCOUNTER — Ambulatory Visit (INDEPENDENT_AMBULATORY_CARE_PROVIDER_SITE_OTHER): Payer: Medicare Other | Admitting: Family Medicine

## 2021-12-17 ENCOUNTER — Encounter (INDEPENDENT_AMBULATORY_CARE_PROVIDER_SITE_OTHER): Payer: Self-pay | Admitting: Family Medicine

## 2021-12-17 VITALS — BP 124/79 | HR 62 | Temp 98.0°F | Ht <= 58 in | Wt 176.0 lb

## 2021-12-17 DIAGNOSIS — E559 Vitamin D deficiency, unspecified: Secondary | ICD-10-CM | POA: Diagnosis not present

## 2021-12-17 DIAGNOSIS — Z7984 Long term (current) use of oral hypoglycemic drugs: Secondary | ICD-10-CM | POA: Diagnosis not present

## 2021-12-17 DIAGNOSIS — E1169 Type 2 diabetes mellitus with other specified complication: Secondary | ICD-10-CM

## 2021-12-17 DIAGNOSIS — E669 Obesity, unspecified: Secondary | ICD-10-CM | POA: Diagnosis not present

## 2021-12-17 DIAGNOSIS — Z6837 Body mass index (BMI) 37.0-37.9, adult: Secondary | ICD-10-CM | POA: Diagnosis not present

## 2021-12-17 MED ORDER — VITAMIN D (ERGOCALCIFEROL) 1.25 MG (50000 UNIT) PO CAPS: ORAL_CAPSULE | ORAL | 0 refills | Status: DC

## 2021-12-19 ENCOUNTER — Other Ambulatory Visit (INDEPENDENT_AMBULATORY_CARE_PROVIDER_SITE_OTHER): Payer: Self-pay | Admitting: Family Medicine

## 2021-12-19 DIAGNOSIS — J3089 Other allergic rhinitis: Secondary | ICD-10-CM

## 2022-01-01 NOTE — Progress Notes (Signed)
Chief Complaint:   OBESITY Stephanie [name] is here to discuss her progress with her obesity treatment plan along with follow-up of her obesity related diagnoses. Rogan is on the Category 2 Plan and keeping a food journal and adhering to recommended goals of 200-300 calories and 20+ grams of protein at breakfast daily and states she is following her eating plan approximately 30% of the time. Yixin states she is doing 0 minutes 0 times per week.  Today's visit was #: 18 Starting weight: 228 lbs Starting date: 02/12/2021 Today's weight: 176 lbs Today's date: 12/17/2021 Total lbs lost to date: 9 Total lbs lost since last in-office visit: 1  Interim History: Reminisce continues to do well with weight loss. She has had some extra challenges and will be doing more traveling and celebration eating in the next 2 weeks.   Subjective:   1. Vitamin D deficiency Stephanie [name]'s Vitamin D level is at goal on her Vitamin D prescription. No side effects were noted. I discussed labs with the patient today.   2. Type 2 diabetes mellitus with other specified complication, without long-term current use of insulin (HCC) Stephanie [name]'s A1c is very well controlled on metformin and with diet and exercise. She denies nausea, vomiting, or hypoglycemia. I discussed labs with the patient today.  Assessment/Plan:   1. Vitamin D deficiency We will refill prescription Vitamin D for 1 month. Maniah will follow-up for routine testing of Vitamin D, at least 2-3 times per year to avoid over-replacement.  - Vitamin D, Ergocalciferol, (DRISDOL) 1.25 MG (50000 UNIT) CAPS capsule; 1 po q 10d  Dispense: 3 capsule; Refill: 0  2. Type 2 diabetes mellitus with other specified complication, without long-term current use of insulin ([place]) Stephanie [name] will continue with her diet, exercise, and weight loss.  3. Obesity, Current BMI 37.0 Stephanie [name] is currently in the action stage of change. As such, her goal is to continue with weight loss efforts. She has agreed  to the Category 2 Plan.   Behavioral modification strategies: travel eating strategies and celebration eating strategies.  Stephanie [name] has agreed to follow-up with our clinic in 4 weeks. She was informed of the importance of frequent follow-up visits to maximize her success with intensive lifestyle modifications for her multiple health conditions.   Objective:   Blood pressure 124/79, pulse 62, temperature 98 F (36.7 C), height 4\' 10"  (1.473 m), weight 176 lb (79.8 kg), SpO2 97 %. Body mass index is 36.78 kg/m.  General: Cooperative, alert, well developed, in no acute distress. HEENT: Conjunctivae and lids unremarkable. Cardiovascular: Regular rhythm.  Lungs: Normal work of breathing. Neurologic: No focal deficits.   Lab Results  Component Value Date   CREATININE 0.75 11/25/2021   BUN 21 11/25/2021   NA 138 11/25/2021   K 4.3 11/25/2021   CL 97 11/25/2021   CO2 23 11/25/2021   Lab Results  Component Value Date   ALT 20 11/25/2021   AST 25 11/25/2021   ALKPHOS 70 11/25/2021   BILITOT 0.3 11/25/2021   Lab Results  Component Value Date   HGBA1C 5.0 11/25/2021   HGBA1C 5.4 08/01/2021   HGBA1C 5.5 04/24/2021   HGBA1C 6.5 12/06/2020   HGBA1C 4.9 07/19/2018   Lab Results  Component Value Date   INSULIN 13.2 11/25/2021   INSULIN 13.0 08/01/2021   INSULIN 15.6 02/12/2021   Lab Results  Component Value Date   TSH 1.740 09/06/2021   Lab Results  Component Value Date   CHOL 171 11/25/2021  HDL 63 11/25/2021   LDLCALC 76 11/25/2021   TRIG 197 (H) 11/25/2021   Lab Results  Component Value Date   VD25OH 62.9 11/25/2021   VD25OH 72.0 08/01/2021   VD25OH 68.6 04/24/2021   Lab Results  Component Value Date   WBC 5.3 02/12/2021   HGB 12.4 02/12/2021   HCT 37.3 02/12/2021   MCV 92 02/12/2021   PLT 256 02/12/2021   No results found for: IRON, TIBC, FERRITIN  Obesity Behavioral Intervention:   Approximately 15 minutes were spent on the discussion below.  ASK: We  discussed the diagnosis of obesity with Stephanie [name] today and Stephanie [name] agreed to give [name] permission to discuss obesity behavioral modification therapy today.  ASSESS: [name] has the diagnosis of obesity and her BMI today is 37.0. Stephanie [name] is in the action stage of change.   ADVISE: Stephanie [name] was educated on the multiple health risks of obesity as well as the benefit of weight loss to improve her health. She was advised of the need for long term treatment and the importance of lifestyle modifications to improve her current health and to decrease her risk of future health problems.  AGREE: Multiple dietary modification options and treatment options were discussed and Stephanie [name] agreed to follow the recommendations documented in the above note.  ARRANGE: Stephanie [name] was educated on the importance of frequent visits to treat obesity as outlined per CMS and USPSTF guidelines and agreed to schedule her next follow up appointment today.  Attestation Statements:   Reviewed by clinician on day of visit: allergies, medications, problem list, medical history, surgical history, family history, social history, and previous encounter notes.   I, Trixie Dredge, am acting as transcriptionist for Dennard Nip, MD.  I have reviewed the above documentation for accuracy and completeness, and I agree with the above. -  Dennard Nip, MD

## 2022-01-07 ENCOUNTER — Encounter (INDEPENDENT_AMBULATORY_CARE_PROVIDER_SITE_OTHER): Payer: Self-pay | Admitting: Family Medicine

## 2022-01-07 ENCOUNTER — Ambulatory Visit (INDEPENDENT_AMBULATORY_CARE_PROVIDER_SITE_OTHER): Payer: Medicare Other | Admitting: Family Medicine

## 2022-01-07 VITALS — BP 108/68 | HR 104 | Temp 97.9°F | Ht <= 58 in | Wt 171.0 lb

## 2022-01-07 DIAGNOSIS — Z6835 Body mass index (BMI) 35.0-35.9, adult: Secondary | ICD-10-CM

## 2022-01-07 DIAGNOSIS — E559 Vitamin D deficiency, unspecified: Secondary | ICD-10-CM

## 2022-01-07 DIAGNOSIS — J3089 Other allergic rhinitis: Secondary | ICD-10-CM

## 2022-01-07 DIAGNOSIS — J302 Other seasonal allergic rhinitis: Secondary | ICD-10-CM

## 2022-01-07 DIAGNOSIS — E1169 Type 2 diabetes mellitus with other specified complication: Secondary | ICD-10-CM | POA: Diagnosis not present

## 2022-01-07 DIAGNOSIS — Z7985 Long-term (current) use of injectable non-insulin antidiabetic drugs: Secondary | ICD-10-CM | POA: Diagnosis not present

## 2022-01-07 DIAGNOSIS — E669 Obesity, unspecified: Secondary | ICD-10-CM | POA: Diagnosis not present

## 2022-01-07 MED ORDER — MONTELUKAST SODIUM 10 MG PO TABS: 10.0000 mg | ORAL_TABLET | Freq: Every day | ORAL | 0 refills | Status: DC

## 2022-01-07 MED ORDER — OZEMPIC (0.25 OR 0.5 MG/DOSE) 2 MG/1.5ML ~~LOC~~ SOPN: 0.5000 mg | PEN_INJECTOR | SUBCUTANEOUS | 0 refills | Status: DC

## 2022-01-07 MED ORDER — VITAMIN D (ERGOCALCIFEROL) 1.25 MG (50000 UNIT) PO CAPS: ORAL_CAPSULE | ORAL | 0 refills | Status: DC

## 2022-01-14 ENCOUNTER — Other Ambulatory Visit (INDEPENDENT_AMBULATORY_CARE_PROVIDER_SITE_OTHER): Payer: Self-pay | Admitting: Family Medicine

## 2022-01-14 DIAGNOSIS — E559 Vitamin D deficiency, unspecified: Secondary | ICD-10-CM

## 2022-01-15 ENCOUNTER — Encounter (INDEPENDENT_AMBULATORY_CARE_PROVIDER_SITE_OTHER): Payer: Self-pay | Admitting: Family Medicine

## 2022-01-15 NOTE — Progress Notes (Signed)
Chief Complaint:   OBESITY Shalece is here to discuss her progress with her obesity treatment plan along with follow-up of her obesity related diagnoses. Iantha is on the Category 2 Plan and states she is following her eating plan approximately 50% of the time. Yitzel states she is walking 10 minutes 4 times per week.  Today's visit was #: 64 Starting weight: 228 lbs Starting date: 02/12/2021 Today's weight: 171 lbs Today's date: 01/07/2022 Total lbs lost to date: 30 Total lbs lost since last in-office visit: 5  Interim History: Inika just back from a week long vacation at the beach. She walked 2+ hours a day and tried to eat on plan the best she could. She did great!  Subjective:   1. Type 2 diabetes mellitus with other specified complication, without long-term current use of insulin (HCC) Pt increased Ozempic to 0.5 mg a month ago. Her fasting blood sugars run in the 100's. She denies lows or highs. Pt has no concerns with medication. She denies GERD or constipation.  2. Seasonal allergies Eilzabeth has not needed to use her inhaler at all since starting Singulair. She is able to exercise. Allergy symptoms well controlled.  3. Vitamin D deficiency Discussed labs with patient today. Pt's recent Vit D level was 62.9 with every 10 day dosing. She is asymptomatic with great energy level.  Assessment/Plan:  No orders of the defined types were placed in this encounter.   Medications Discontinued During This Encounter  Medication Reason   Semaglutide,0.25 or 0.5MG /DOS, (OZEMPIC, 0.25 OR 0.5 MG/DOSE,) 2 MG/1.5ML SOPN Reorder   montelukast (SINGULAIR) 10 MG tablet Reorder   Vitamin D, Ergocalciferol, (DRISDOL) 1.25 MG (50000 UNIT) CAPS capsule Reorder     Meds ordered this encounter  Medications   Vitamin D, Ergocalciferol, (DRISDOL) 1.25 MG (50000 UNIT) CAPS capsule    Sig: 1 po q 14d    Dispense:  6 capsule    Refill:  0    Ov for RF; 90 d supply   Semaglutide,0.25 or 0.5MG /DOS,  (OZEMPIC, 0.25 OR 0.5 MG/DOSE,) 2 MG/1.5ML SOPN    Sig: Inject 0.5 mg into the skin once a week.    Dispense:  3 mL    Refill:  0    30 d supply;  ** OV for RF **   Do not send RF request   montelukast (SINGULAIR) 10 MG tablet    Sig: Take 1 tablet (10 mg total) by mouth at bedtime.    Dispense:  30 tablet    Refill:  0     1. Type 2 diabetes mellitus with other specified complication, without long-term current use of insulin (HCC) Good blood sugar control is important to decrease the likelihood of diabetic complications such as nephropathy, neuropathy, limb loss, blindness, coronary artery disease, and death. Intensive lifestyle modification including diet, exercise and weight loss are the first line of treatment for diabetes.   Refill- Semaglutide,0.25 or 0.5MG /DOS, (OZEMPIC, 0.25 OR 0.5 MG/DOSE,) 2 MG/1.5ML SOPN; Inject 0.5 mg into the skin once a week.  Dispense: 3 mL; Refill: 0  2. Seasonal allergies Continue current treatment plan.  Refill- montelukast (SINGULAIR) 10 MG tablet; Take 1 tablet (10 mg total) by mouth at bedtime.  Dispense: 30 tablet; Refill: 0  3. Vitamin D deficiency Low Vitamin D level contributes to fatigue and are associated with obesity, breast, and colon cancer. She agrees to decrease prescription Vitamin D @50 ,000 IU to every 14 days and will follow-up for routine testing  of Vitamin D, at least 2-3 times per year to avoid over-replacement.  Decrease & Refill- Vitamin D, Ergocalciferol, (DRISDOL) 1.25 MG (50000 UNIT) CAPS capsule; 1 po q 14d  Dispense: 6 capsule; Refill: 0  4. Obesity, Current BMI 35.9 Marlina is currently in the action stage of change. As such, her goal is to continue with weight loss efforts. She has agreed to the Category 2 Plan.   Pt goal is to walk for 30 minutes daily.  Exercise goals: For substantial health benefits, adults should do at least 150 minutes (2 hours and 30 minutes) a week of moderate-intensity, or 75 minutes (1 hour and 15  minutes) a week of vigorous-intensity aerobic physical activity, or an equivalent combination of moderate- and vigorous-intensity aerobic activity. Aerobic activity should be performed in episodes of at least 10 minutes, and preferably, it should be spread throughout the week.  Behavioral modification strategies: increasing water intake.  Jannah has agreed to follow-up with our clinic in 3-4 weeks. She was informed of the importance of frequent follow-up visits to maximize her success with intensive lifestyle modifications for her multiple health conditions.   Objective:   Blood pressure 108/68, pulse (!) 104, temperature 97.9 F (36.6 C), height 4\' 10"  (1.473 m), weight 171 lb (77.6 kg). Body mass index is 35.74 kg/m.  General: Cooperative, alert, well developed, in no acute distress. HEENT: Conjunctivae and lids unremarkable. Cardiovascular: Regular rhythm.  Lungs: Normal work of breathing. Neurologic: No focal deficits.   Lab Results  Component Value Date   CREATININE 0.75 11/25/2021   BUN 21 11/25/2021   NA 138 11/25/2021   K 4.3 11/25/2021   CL 97 11/25/2021   CO2 23 11/25/2021   Lab Results  Component Value Date   ALT 20 11/25/2021   AST 25 11/25/2021   ALKPHOS 70 11/25/2021   BILITOT 0.3 11/25/2021   Lab Results  Component Value Date   HGBA1C 5.0 11/25/2021   HGBA1C 5.4 08/01/2021   HGBA1C 5.5 04/24/2021   HGBA1C 6.5 12/06/2020   HGBA1C 4.9 07/19/2018   Lab Results  Component Value Date   INSULIN 13.2 11/25/2021   INSULIN 13.0 08/01/2021   INSULIN 15.6 02/12/2021   Lab Results  Component Value Date   TSH 1.740 09/06/2021   Lab Results  Component Value Date   CHOL 171 11/25/2021   HDL 63 11/25/2021   LDLCALC 76 11/25/2021   TRIG 197 (H) 11/25/2021   Lab Results  Component Value Date   VD25OH 62.9 11/25/2021   VD25OH 72.0 08/01/2021   VD25OH 68.6 04/24/2021   Lab Results  Component Value Date   WBC 5.3 02/12/2021   HGB 12.4 02/12/2021   HCT  37.3 02/12/2021   MCV 92 02/12/2021   PLT 256 02/12/2021   No results found for: IRON, TIBC, FERRITIN  Obesity Behavioral Intervention:   Approximately 15 minutes were spent on the discussion below.  ASK: We discussed the diagnosis of obesity with [name] today and Alta agreed to give [name] permission to discuss obesity behavioral modification therapy today.  ASSESS: Jezel has the diagnosis of obesity and her BMI today is 35.9. Sharelle is in the action stage of change.   ADVISE: Ron was educated on the multiple health risks of obesity as well as the benefit of weight loss to improve her health. She was advised of the need for long term treatment and the importance of lifestyle modifications to improve her current health and to decrease her risk of future health problems.  AGREE: Multiple dietary modification options and treatment options were discussed and Taliya agreed to follow the recommendations documented in the above note.  ARRANGE: Aleezah was educated on the importance of frequent visits to treat obesity as outlined per CMS and USPSTF guidelines and agreed to schedule her next follow up appointment today.  Attestation Statements:   Reviewed by clinician on day of visit: allergies, medications, problem list, medical history, surgical history, family history, social history, and previous encounter notes.  I, Kathlene November, BS, CMA, am acting as transcriptionist for [employer], DO.  I have reviewed the above documentation for accuracy and completeness, and I agree with the above. Marjory Sneddon, D.O.  The [place] was signed into law in 2016 which includes the topic of electronic health records.  This provides immediate access to information in MyChart.  This includes consultation notes, operative notes, office notes, lab results and pathology reports.  If you have any questions about what you read please let us know at your next visit so we can discuss your  concerns and take corrective action if need be.  We are right here with you.

## 2022-01-28 ENCOUNTER — Ambulatory Visit (INDEPENDENT_AMBULATORY_CARE_PROVIDER_SITE_OTHER): Payer: Medicare Other | Admitting: Family Medicine

## 2022-02-03 ENCOUNTER — Encounter (INDEPENDENT_AMBULATORY_CARE_PROVIDER_SITE_OTHER): Payer: Self-pay | Admitting: Family Medicine

## 2022-02-03 ENCOUNTER — Ambulatory Visit (INDEPENDENT_AMBULATORY_CARE_PROVIDER_SITE_OTHER): Payer: Medicare Other | Admitting: Family Medicine

## 2022-02-03 VITALS — BP 115/73 | HR 62 | Temp 98.3°F | Ht <= 58 in | Wt 173.0 lb

## 2022-02-03 DIAGNOSIS — E669 Obesity, unspecified: Secondary | ICD-10-CM

## 2022-02-03 DIAGNOSIS — Z7985 Long-term (current) use of injectable non-insulin antidiabetic drugs: Secondary | ICD-10-CM | POA: Diagnosis not present

## 2022-02-03 DIAGNOSIS — Z7984 Long term (current) use of oral hypoglycemic drugs: Secondary | ICD-10-CM

## 2022-02-03 DIAGNOSIS — E1169 Type 2 diabetes mellitus with other specified complication: Secondary | ICD-10-CM

## 2022-02-03 DIAGNOSIS — Z6836 Body mass index (BMI) 36.0-36.9, adult: Secondary | ICD-10-CM | POA: Diagnosis not present

## 2022-02-03 MED ORDER — METFORMIN HCL 500 MG PO TABS: ORAL_TABLET | ORAL | Status: DC

## 2022-02-08 NOTE — Progress Notes (Signed)
Chief Complaint:   OBESITY Stephanie [name] is here to discuss her progress with her obesity treatment plan along with follow-up of her obesity related diagnoses. Stephanie [name] is on the Category 2 Plan and states she is following her eating plan approximately 0% of the time. Stephanie [name] states she is walking 15-20 minutes 2 times per week.  Today's visit was #: 20 Starting weight: 228 lbs Starting date: 02/12/2021 Today's weight: 173 lbs Today's date: 02/03/2022 Total lbs lost to date: 55 Total lbs lost since last in-office visit: +2  Interim History: Stephanie [name] is trying to use "[employer]" for assistance. Her nephew was shot and killed by police on 01/09/2022. Pt is not eating all meals and is doing more snacking.  Subjective:   1. Type 2 diabetes mellitus with other specified complication, without long-term current use of insulin (HCC) Stephanie [name] has been doing more snacking lately due to stress with the loss of a family member. Her A1c is at goal!  Assessment/Plan:  No orders of the defined types were placed in this encounter.   Medications Discontinued During This Encounter  Medication Reason   EMGALITY 120 MG/ML SOAJ Patient Preference   metFORMIN (GLUCOPHAGE) 500 MG tablet      Meds ordered this encounter  Medications   metFORMIN (GLUCOPHAGE) 500 MG tablet    Sig: 1 po three times daily with meals ( pt has plenty and doesn't need RF-  Will only tid use if out of ozempic)     1. Type 2 diabetes mellitus with other specified complication, without long-term current use of insulin (HCC) Good blood sugar control is important to decrease the likelihood of diabetic complications such as nephropathy, neuropathy, limb loss, blindness, coronary artery disease, and death. Intensive lifestyle modification including diet, exercise and weight loss are the first line of treatment for diabetes. Increase Metformin to 500 mg TID, if Ozempic runs out.  Increase, if Ozempic runs out & Refill- metFORMIN (GLUCOPHAGE) 500  MG tablet; 1 po three times daily with meals ( pt has plenty and doesn't need RF-  Will only tid use if out of Ozempic)  2. Obesity, Current BMI 36.2 Stephanie [name] is currently in the action stage of change. As such, her goal is to continue with weight loss efforts. She has agreed to the Category 2 Plan.   Increase exercise to walking 20 minutes daily and no skipping meals.  Exercise goals:  As is  Behavioral modification strategies: increasing lean protein intake, decreasing simple carbohydrates, no skipping meals, and planning for success.  Stephanie [name] has agreed to follow-up with our clinic in 3 weeks. She was informed of the importance of frequent follow-up visits to maximize her success with intensive lifestyle modifications for her multiple health conditions.   Objective:   Blood pressure 115/73, pulse 62, temperature 98.3 F (36.8 C), height 4\' 10"  (1.473 m), weight 173 lb (78.5 kg), SpO2 97 %. Body mass index is 36.16 kg/m.  General: Cooperative, alert, well developed, in no acute distress. HEENT: Conjunctivae and lids unremarkable. Cardiovascular: Regular rhythm.  Lungs: Normal work of breathing. Neurologic: No focal deficits.   Lab Results  Component Value Date   CREATININE 0.75 11/25/2021   BUN 21 11/25/2021   NA 138 11/25/2021   K 4.3 11/25/2021   CL 97 11/25/2021   CO2 23 11/25/2021   Lab Results  Component Value Date   ALT 20 11/25/2021   AST 25 11/25/2021   ALKPHOS 70 11/25/2021   BILITOT 0.3 11/25/2021   Lab  Results  Component Value Date   HGBA1C 5.0 11/25/2021   HGBA1C 5.4 08/01/2021   HGBA1C 5.5 04/24/2021   HGBA1C 6.5 12/06/2020   HGBA1C 4.9 07/19/2018   Lab Results  Component Value Date   INSULIN 13.2 11/25/2021   INSULIN 13.0 08/01/2021   INSULIN 15.6 02/12/2021   Lab Results  Component Value Date   TSH 1.740 09/06/2021   Lab Results  Component Value Date   CHOL 171 11/25/2021   HDL 63 11/25/2021   LDLCALC 76 11/25/2021   TRIG 197 (H) 11/25/2021    Lab Results  Component Value Date   VD25OH 62.9 11/25/2021   VD25OH 72.0 08/01/2021   VD25OH 68.6 04/24/2021   Lab Results  Component Value Date   WBC 5.3 02/12/2021   HGB 12.4 02/12/2021   HCT 37.3 02/12/2021   MCV 92 02/12/2021   PLT 256 02/12/2021   No results found for: "IRON", "TIBC", "FERRITIN"  Obesity Behavioral Intervention:   Approximately 15 minutes were spent on the discussion below.  ASK: We discussed the diagnosis of obesity with Stephanie [name] today and Stephanie [name] agreed to give [name] permission to discuss obesity behavioral modification therapy today.  ASSESS: Stephanie [name] has the diagnosis of obesity and her BMI today is 36.2. Stephanie [name] is in the action stage of change.   ADVISE: Stephanie [name] was educated on the multiple health risks of obesity as well as the benefit of weight loss to improve her health. She was advised of the need for long term treatment and the importance of lifestyle modifications to improve her current health and to decrease her risk of future health problems.  AGREE: Multiple dietary modification options and treatment options were discussed and Stephanie [name] agreed to follow the recommendations documented in the above note.  ARRANGE: Stephanie [name] was educated on the importance of frequent visits to treat obesity as outlined per CMS and USPSTF guidelines and agreed to schedule her next follow up appointment today.  Attestation Statements:   Reviewed by clinician on day of visit: allergies, medications, problem list, medical history, surgical history, family history, social history, and previous encounter notes.  I, Stephanie [name], BS, CMA, am acting as transcriptionist for [employer], DO.  I have reviewed the above documentation for accuracy and completeness, and I agree with the above. -   I have reviewed the above documentation for accuracy and completeness, and I agree with the above. Stephanie [name], D.O.  The 21st Century Cures Act was signed into law in 2016 which  includes the topic of electronic health records.  This provides immediate access to information in MyChart.  This includes consultation notes, operative notes, office notes, lab results and pathology reports.  If you have any questions about what you read please let us know at your next visit so we can discuss your concerns and take corrective action if need be.  We are right here with you.

## 2022-02-11 ENCOUNTER — Other Ambulatory Visit (INDEPENDENT_AMBULATORY_CARE_PROVIDER_SITE_OTHER): Payer: Self-pay | Admitting: Family Medicine

## 2022-02-11 DIAGNOSIS — J302 Other seasonal allergic rhinitis: Secondary | ICD-10-CM

## 2022-02-24 ENCOUNTER — Encounter (INDEPENDENT_AMBULATORY_CARE_PROVIDER_SITE_OTHER): Payer: Self-pay | Admitting: Family Medicine

## 2022-02-24 ENCOUNTER — Ambulatory Visit (INDEPENDENT_AMBULATORY_CARE_PROVIDER_SITE_OTHER): Payer: Medicare Other | Admitting: Family Medicine

## 2022-02-24 VITALS — BP 130/80 | HR 56 | Temp 97.6°F | Ht <= 58 in | Wt 170.0 lb

## 2022-02-24 DIAGNOSIS — Z7984 Long term (current) use of oral hypoglycemic drugs: Secondary | ICD-10-CM | POA: Diagnosis not present

## 2022-02-24 DIAGNOSIS — E559 Vitamin D deficiency, unspecified: Secondary | ICD-10-CM | POA: Diagnosis not present

## 2022-02-24 DIAGNOSIS — E669 Obesity, unspecified: Secondary | ICD-10-CM | POA: Diagnosis not present

## 2022-02-24 DIAGNOSIS — Z6835 Body mass index (BMI) 35.0-35.9, adult: Secondary | ICD-10-CM

## 2022-02-24 DIAGNOSIS — E1169 Type 2 diabetes mellitus with other specified complication: Secondary | ICD-10-CM | POA: Diagnosis not present

## 2022-02-27 NOTE — Progress Notes (Signed)
Chief Complaint:   OBESITY Stephanie [name] is here to discuss her progress with her obesity treatment plan along with follow-up of her obesity related diagnoses. Stephanie [name] is on the Category 2 Plan and states she is following her eating plan approximately 50% of the time. Stephanie [name] states she is walking 15 minutes 2 times per week.  Today's visit was #: 21 Starting weight: 228 lbs Starting date: 02/12/2021 Today's weight: 170 lbs Today's date: 02/24/2022 Total lbs lost to date: 58 Total lbs lost since last in-office visit: 3  Interim History: Stephanie [name] is here for a follow up office visit.  We reviewed her meal plan and all questions were answered. Patient's food recall appears to be accurate and consistent with what is on plan when she is following it. When eating on plan, her hunger and cravings are well controlled.   Pt has started walking some but hasn't been following the meal plan much- only 50% of the time.  Subjective:   1. Type 2 diabetes mellitus with other specified complication, without long-term current use of insulin (HCC) Stephanie [name] is only taking Metformin now. [facility] has approved Ozempic, but we don't know where the prescription needs to be sent. She did increase Metformin to 500 mg TID, from BID, and it is helping with cravings and hunger.  2. Vitamin D deficiency She is currently taking prescription vitamin D 50,000 IU every 14 days. She denies nausea, vomiting or muscle weakness.  Assessment/Plan:  No orders of the defined types were placed in this encounter.   Medications Discontinued During This Encounter  Medication Reason   montelukast (SINGULAIR) 10 MG tablet      No orders of the defined types were placed in this encounter.    1. Type 2 diabetes mellitus with other specified complication, without long-term current use of insulin (HCC) Continue Metformin TID. Pt denies need for refill and states she has plenty. Call "[facility]" and find out where they need [name] to  send Ozempic to. Continue prudent nutritional plan and weight loss. A1c at goal.  2. Vitamin D deficiency Vit D was at goal at 62.9 about 3 months ago. Low Vitamin D level contributes to fatigue and are associated with obesity, breast, and colon cancer. She agrees to continue to take prescription Vitamin D @50 ,000 IU every 14 days and will follow-up for routine testing of Vitamin D, at least 2-3 times per year to avoid over-replacement. Pt declines need for refill.continue meds 1 tab every 14 days.  3. Obesity, Current BMI 35.7 Stephanie [name] is currently in the action stage of change. As such, her goal is to continue with weight loss efforts. She has agreed to the Category 2 Plan.   Exercise goals:  Increase as tolerated in time and frequency.  Behavioral modification strategies: increasing lean protein intake, decreasing simple carbohydrates, increasing water intake, meal planning and cooking strategies, and better snacking choices.  Stephanie [name] has agreed to follow-up with our clinic in 3 weeks. She was informed of the importance of frequent follow-up visits to maximize her success with intensive lifestyle modifications for her multiple health conditions.   Objective:   Blood pressure 130/80, pulse (!) 56, temperature 97.6 F (36.4 C), height 4\' 10"  (1.473 m), weight 170 lb (77.1 kg), SpO2 98 %. Body mass index is 35.53 kg/m.  General: Cooperative, alert, well developed, in no acute distress. HEENT: Conjunctivae and lids unremarkable. Cardiovascular: Regular rhythm.  Lungs: Normal work of breathing. Neurologic: No focal deficits.  Lab Results  Component Value Date   CREATININE 0.75 11/25/2021   BUN 21 11/25/2021   NA 138 11/25/2021   K 4.3 11/25/2021   CL 97 11/25/2021   CO2 23 11/25/2021   Lab Results  Component Value Date   ALT 20 11/25/2021   AST 25 11/25/2021   ALKPHOS 70 11/25/2021   BILITOT 0.3 11/25/2021   Lab Results  Component Value Date   HGBA1C 5.0 11/25/2021   HGBA1C  5.4 08/01/2021   HGBA1C 5.5 04/24/2021   HGBA1C 6.5 12/06/2020   HGBA1C 4.9 07/19/2018   Lab Results  Component Value Date   INSULIN 13.2 11/25/2021   INSULIN 13.0 08/01/2021   INSULIN 15.6 02/12/2021   Lab Results  Component Value Date   TSH 1.740 09/06/2021   Lab Results  Component Value Date   CHOL 171 11/25/2021   HDL 63 11/25/2021   LDLCALC 76 11/25/2021   TRIG 197 (H) 11/25/2021   Lab Results  Component Value Date   VD25OH 62.9 11/25/2021   VD25OH 72.0 08/01/2021   VD25OH 68.6 04/24/2021   Lab Results  Component Value Date   WBC 5.3 02/12/2021   HGB 12.4 02/12/2021   HCT 37.3 02/12/2021   MCV 92 02/12/2021   PLT 256 02/12/2021    Attestation Statements:   Reviewed by clinician on day of visit: allergies, medications, problem list, medical history, surgical history, family history, social history, and previous encounter notes.  Time spent on visit including pre-visit chart review and post-visit care and charting was 30 minutes.   I, Stephanie [name], BS, CMA, am acting as transcriptionist for [employer], DO.   I have reviewed the above documentation for accuracy and completeness, and I agree with the above. Stephanie [name], D.O.  The 21st Century Cures Act was signed into law in 2016 which includes the topic of electronic health records.  This provides immediate access to information in MyChart.  This includes consultation notes, operative notes, office notes, lab results and pathology reports.  If you have any questions about what you read please let us know at your next visit so we can discuss your concerns and take corrective action if need be.  We are right here with you.

## 2022-03-03 ENCOUNTER — Encounter (INDEPENDENT_AMBULATORY_CARE_PROVIDER_SITE_OTHER): Payer: Self-pay | Admitting: Family Medicine

## 2022-03-03 NOTE — Telephone Encounter (Signed)
Please review

## 2022-03-04 ENCOUNTER — Encounter (INDEPENDENT_AMBULATORY_CARE_PROVIDER_SITE_OTHER): Payer: Self-pay

## 2022-03-04 ENCOUNTER — Other Ambulatory Visit (INDEPENDENT_AMBULATORY_CARE_PROVIDER_SITE_OTHER): Payer: Self-pay

## 2022-03-04 DIAGNOSIS — E1169 Type 2 diabetes mellitus with other specified complication: Secondary | ICD-10-CM

## 2022-03-04 MED ORDER — OZEMPIC (0.25 OR 0.5 MG/DOSE) 2 MG/1.5ML ~~LOC~~ SOPN: 0.5000 mg | PEN_INJECTOR | SUBCUTANEOUS | 0 refills | Status: DC

## 2022-03-04 MED ORDER — OZEMPIC (0.25 OR 0.5 MG/DOSE) 2 MG/1.5ML ~~LOC~~ SOPN: 0.5000 mg | PEN_INJECTOR | SUBCUTANEOUS | 0 refills | Status: AC

## 2022-03-05 DIAGNOSIS — I10 Essential (primary) hypertension: Secondary | ICD-10-CM | POA: Diagnosis not present

## 2022-03-05 DIAGNOSIS — G4700 Insomnia, unspecified: Secondary | ICD-10-CM | POA: Diagnosis not present

## 2022-03-05 DIAGNOSIS — J45909 Unspecified asthma, uncomplicated: Secondary | ICD-10-CM | POA: Diagnosis not present

## 2022-03-05 DIAGNOSIS — K219 Gastro-esophageal reflux disease without esophagitis: Secondary | ICD-10-CM | POA: Diagnosis not present

## 2022-03-05 DIAGNOSIS — J309 Allergic rhinitis, unspecified: Secondary | ICD-10-CM | POA: Diagnosis not present

## 2022-03-05 DIAGNOSIS — E7800 Pure hypercholesterolemia, unspecified: Secondary | ICD-10-CM | POA: Diagnosis not present

## 2022-03-05 DIAGNOSIS — E1169 Type 2 diabetes mellitus with other specified complication: Secondary | ICD-10-CM | POA: Diagnosis not present

## 2022-03-17 ENCOUNTER — Ambulatory Visit (INDEPENDENT_AMBULATORY_CARE_PROVIDER_SITE_OTHER): Payer: Medicare Other | Admitting: Family Medicine

## 2022-03-24 ENCOUNTER — Encounter (INDEPENDENT_AMBULATORY_CARE_PROVIDER_SITE_OTHER): Payer: Self-pay | Admitting: Family Medicine

## 2022-03-24 ENCOUNTER — Ambulatory Visit (INDEPENDENT_AMBULATORY_CARE_PROVIDER_SITE_OTHER): Payer: Medicare Other | Admitting: Family Medicine

## 2022-03-24 VITALS — BP 130/79 | HR 56 | Temp 98.0°F | Ht <= 58 in | Wt 171.0 lb

## 2022-03-24 DIAGNOSIS — E1169 Type 2 diabetes mellitus with other specified complication: Secondary | ICD-10-CM | POA: Diagnosis not present

## 2022-03-24 DIAGNOSIS — E669 Obesity, unspecified: Secondary | ICD-10-CM

## 2022-03-24 DIAGNOSIS — Z6835 Body mass index (BMI) 35.0-35.9, adult: Secondary | ICD-10-CM

## 2022-03-24 DIAGNOSIS — E559 Vitamin D deficiency, unspecified: Secondary | ICD-10-CM

## 2022-03-24 DIAGNOSIS — E785 Hyperlipidemia, unspecified: Secondary | ICD-10-CM

## 2022-03-24 MED ORDER — VITAMIN D (ERGOCALCIFEROL) 1.25 MG (50000 UNIT) PO CAPS: ORAL_CAPSULE | ORAL | 0 refills | Status: DC

## 2022-03-26 ENCOUNTER — Encounter (INDEPENDENT_AMBULATORY_CARE_PROVIDER_SITE_OTHER): Payer: Self-pay

## 2022-03-29 NOTE — Progress Notes (Unsigned)
Chief Complaint:   OBESITY Stephanie [name] is here to discuss her progress with her obesity treatment plan along with follow-up of her obesity related diagnoses. Stephanie [name] is on the Category 2 Plan and states she is following her eating plan approximately 30% of the time. Stephanie [name] states she is walking 10 minutes 3 times per week.  Today's visit was #: 22 Starting weight: 228 lbs Starting date: 02/12/2021 Today's weight: 171 lbs Today's date: 03/24/2022 Total lbs lost to date: 57 Total lbs lost since last in-office visit: +1  Interim History: Stephanie [name] is snacking on a lot of fruits and not measuring amounts. She feels like snacking instead of eating her meals. Pt is not measuring proteins.  Subjective:   1. Type 2 diabetes mellitus with other specified complication, without long-term current use of insulin (HCC) Stephanie [name] has been off Ozempic for a month due to cost.   2. Hyperlipidemia associated with type 2 diabetes mellitus (HCC) She had elevated triglycerides of 197 on last check and is still on Crestor.  3. Vitamin D deficiency She is currently taking prescription vitamin D 50,000 IU every 14 days. She denies nausea, vomiting or muscle weakness.  Assessment/Plan:  No orders of the defined types were placed in this encounter.   Medications Discontinued During This Encounter  Medication Reason   Vitamin D, Ergocalciferol, (DRISDOL) 1.25 MG (50000 UNIT) CAPS capsule Reorder     Meds ordered this encounter  Medications   Vitamin D, Ergocalciferol, (DRISDOL) 1.25 MG (50000 UNIT) CAPS capsule    Sig: 1 po q 14d    Dispense:  6 capsule    Refill:  0    Ov for RF; 90 d supply     1. Type 2 diabetes mellitus with other specified complication, without long-term current use of insulin (HCC) Good blood sugar control is important to decrease the likelihood of diabetic complications such as nephropathy, neuropathy, limb loss, blindness, coronary artery disease, and death. Intensive lifestyle  modification including diet, exercise and weight loss are the first line of treatment for diabetes.  Continue Metformin. Pt will see if she can get Ozempic with help on payment of $250 for it. Pt feels it helped a lot. Don't skip meals!!  2. Hyperlipidemia associated with type 2 diabetes mellitus (HCC) Cardiovascular risk and specific lipid/LDL goals reviewed.  We discussed several lifestyle modifications today and Stephanie [name] will continue to work on diet, exercise and weight loss efforts. Orders and follow up as documented in patient record.   Counseling Intensive lifestyle modifications are the first line treatment for this issue. Dietary changes: Increase soluble fiber. Decrease simple carbohydrates. Exercise changes: Moderate to vigorous-intensity aerobic activity 150 minutes per week if tolerated. Lipid-lowering medications: see documented in medical record.  3. Vitamin D deficiency Low Vitamin D level contributes to fatigue and are associated with obesity, breast, and colon cancer. She agrees to continue to take prescription Vitamin D 50,000 IU every 14 days and will follow-up for routine testing of Vitamin D, at least 2-3 times per year to avoid over-replacement.  Refill- Vitamin D, Ergocalciferol, (DRISDOL) 1.25 MG (50000 UNIT) CAPS capsule; 1 po q 14d  Dispense: 6 capsule; Refill: 0  4. Obesity, Current BMI 35.7 Stephanie [name] is currently in the action stage of change. As such, her goal is to continue with weight loss efforts. She has agreed to the Category 2 Plan.   Pt educated on how to measure snack calories and advised to do so.  Exercise goals:  Increase as  tolerated.  Behavioral modification strategies: increasing lean protein intake, decreasing simple carbohydrates, and planning for success.  [name] has agreed to follow-up with our clinic in 3-4 weeks fasting for repeat IC and blood work. She was informed of the importance of frequent follow-up visits to maximize her success with  intensive lifestyle modifications for her multiple health conditions.   Objective:   Blood pressure 130/79, pulse (!) 56, temperature 98 F (36.7 C), height 4\' 10"  (1.473 m), weight 171 lb (77.6 kg), SpO2 99 %. Body mass index is 35.74 kg/m.  General: Cooperative, alert, well developed, in no acute distress. HEENT: Conjunctivae and lids unremarkable. Cardiovascular: Regular rhythm.  Lungs: Normal work of breathing. Neurologic: No focal deficits.   Lab Results  Component Value Date   CREATININE 0.75 11/25/2021   BUN 21 11/25/2021   NA 138 11/25/2021   K 4.3 11/25/2021   CL 97 11/25/2021   CO2 23 11/25/2021   Lab Results  Component Value Date   ALT 20 11/25/2021   AST 25 11/25/2021   ALKPHOS 70 11/25/2021   BILITOT 0.3 11/25/2021   Lab Results  Component Value Date   HGBA1C 5.0 11/25/2021   HGBA1C 5.4 08/01/2021   HGBA1C 5.5 04/24/2021   HGBA1C 6.5 12/06/2020   HGBA1C 4.9 07/19/2018   Lab Results  Component Value Date   INSULIN 13.2 11/25/2021   INSULIN 13.0 08/01/2021   INSULIN 15.6 02/12/2021   Lab Results  Component Value Date   TSH 1.740 09/06/2021   Lab Results  Component Value Date   CHOL 171 11/25/2021   HDL 63 11/25/2021   LDLCALC 76 11/25/2021   TRIG 197 (H) 11/25/2021   Lab Results  Component Value Date   VD25OH 62.9 11/25/2021   VD25OH 72.0 08/01/2021   VD25OH 68.6 04/24/2021   Lab Results  Component Value Date   WBC 5.3 02/12/2021   HGB 12.4 02/12/2021   HCT 37.3 02/12/2021   MCV 92 02/12/2021   PLT 256 02/12/2021   No results found for: "IRON", "TIBC", "FERRITIN"  Obesity Behavioral Intervention:   Approximately 15 minutes were spent on the discussion below.  ASK: We discussed the diagnosis of obesity with 02/14/2021 today and Kourtlynn agreed to give Stephanie [name] permission to discuss obesity behavioral modification therapy today.  ASSESS: Stephanie [name] has the diagnosis of obesity and her BMI today is 35.7. Stephanie [name] is in the action stage of change.    ADVISE: Stephanie [name] was educated on the multiple health risks of obesity as well as the benefit of weight loss to improve her health. She was advised of the need for long term treatment and the importance of lifestyle modifications to improve her current health and to decrease her risk of future health problems.  AGREE: Multiple dietary modification options and treatment options were discussed and Hazle agreed to follow the recommendations documented in the above note.  ARRANGE: Yanelie was educated on the importance of frequent visits to treat obesity as outlined per CMS and USPSTF guidelines and agreed to schedule her next follow up appointment today.  Attestation Statements:   Reviewed by clinician on day of visit: allergies, medications, problem list, medical history, surgical history, family history, social history, and previous encounter notes.  I, Stephanie [name], BS, CMA, am acting as transcriptionist for Kyung Rudd, DO.   I have reviewed the above documentation for accuracy and completeness, and I agree with the above. [employer], D.O.  The 21st Century Cures Act was signed into law in 2016 which  includes the topic of electronic health records.  This provides immediate access to information in MyChart.  This includes consultation notes, operative notes, office notes, lab results and pathology reports.  If you have any questions about what you read please let us know at your next visit so we can discuss your concerns and take corrective action if need be.  We are right here with you.

## 2022-04-17 ENCOUNTER — Encounter (INDEPENDENT_AMBULATORY_CARE_PROVIDER_SITE_OTHER): Payer: Self-pay | Admitting: Family Medicine

## 2022-04-23 ENCOUNTER — Ambulatory Visit (INDEPENDENT_AMBULATORY_CARE_PROVIDER_SITE_OTHER): Payer: Medicare Other | Admitting: Family Medicine

## 2022-05-15 ENCOUNTER — Ambulatory Visit (INDEPENDENT_AMBULATORY_CARE_PROVIDER_SITE_OTHER): Payer: Medicare Other | Admitting: Family Medicine

## 2022-06-04 ENCOUNTER — Encounter (INDEPENDENT_AMBULATORY_CARE_PROVIDER_SITE_OTHER): Payer: Self-pay | Admitting: Family Medicine

## 2022-06-04 ENCOUNTER — Telehealth (INDEPENDENT_AMBULATORY_CARE_PROVIDER_SITE_OTHER): Payer: Medicare Other | Admitting: Family Medicine

## 2022-06-04 VITALS — BP 128/83 | HR 64 | Temp 97.5°F | Ht <= 58 in | Wt 169.0 lb

## 2022-06-04 DIAGNOSIS — E669 Obesity, unspecified: Secondary | ICD-10-CM | POA: Diagnosis not present

## 2022-06-04 DIAGNOSIS — R0602 Shortness of breath: Secondary | ICD-10-CM | POA: Diagnosis not present

## 2022-06-04 DIAGNOSIS — E785 Hyperlipidemia, unspecified: Secondary | ICD-10-CM

## 2022-06-04 DIAGNOSIS — Z7985 Long-term (current) use of injectable non-insulin antidiabetic drugs: Secondary | ICD-10-CM

## 2022-06-04 DIAGNOSIS — E559 Vitamin D deficiency, unspecified: Secondary | ICD-10-CM

## 2022-06-04 DIAGNOSIS — Z6835 Body mass index (BMI) 35.0-35.9, adult: Secondary | ICD-10-CM

## 2022-06-04 DIAGNOSIS — E1169 Type 2 diabetes mellitus with other specified complication: Secondary | ICD-10-CM

## 2022-06-04 MED ORDER — METFORMIN HCL 500 MG PO TABS: ORAL_TABLET | ORAL | 0 refills | Status: AC

## 2022-06-04 MED ORDER — VITAMIN D (ERGOCALCIFEROL) 1.25 MG (50000 UNIT) PO CAPS: ORAL_CAPSULE | ORAL | 0 refills | Status: AC

## 2022-06-05 LAB — LIPID PANEL WITH LDL/HDL RATIO
Cholesterol, Total: 200 mg/dL — ABNORMAL HIGH (ref 100–199)
HDL: 84 mg/dL (ref >39)
LDL Chol Calc (NIH): 87 mg/dL (ref 0–99)
LDL/HDL Ratio: 1 ratio (ref 0.0–3.2)
Triglycerides: 177 mg/dL — ABNORMAL HIGH (ref 0–149)
VLDL Cholesterol Cal: 29 mg/dL (ref 5–40)

## 2022-06-05 LAB — COMPREHENSIVE METABOLIC PANEL
ALT: 20 IU/L (ref 0–32)
AST: 23 IU/L (ref 0–40)
Albumin/Globulin Ratio: 1.8 (ref 1.2–2.2)
Albumin: 4.6 g/dL (ref 3.9–4.9)
Alkaline Phosphatase: 69 IU/L (ref 44–121)
BUN/Creatinine Ratio: 25 (ref 12–28)
BUN: 20 mg/dL (ref 8–27)
Bilirubin Total: 0.3 mg/dL (ref 0.0–1.2)
CO2: 27 mmol/L (ref 20–29)
Calcium: 10.1 mg/dL (ref 8.7–10.3)
Chloride: 99 mmol/L (ref 96–106)
Creatinine, Ser: 0.79 mg/dL (ref 0.57–1.00)
Globulin, Total: 2.5 g/dL (ref 1.5–4.5)
Glucose: 84 mg/dL (ref 70–99)
Potassium: 4.6 mmol/L (ref 3.5–5.2)
Sodium: 140 mmol/L (ref 134–144)
Total Protein: 7.1 g/dL (ref 6.0–8.5)
eGFR: 81 mL/min/{1.73_m2} (ref >59)

## 2022-06-05 LAB — INSULIN, RANDOM: INSULIN: 9.9 u[IU]/mL (ref 2.6–24.9)

## 2022-06-05 LAB — HEMOGLOBIN A1C
Est. average glucose Bld gHb Est-mCnc: 108 mg/dL
Hgb A1c MFr Bld: 5.4 % (ref 4.8–5.6)

## 2022-06-05 LAB — VITAMIN D 25 HYDROXY (VIT D DEFICIENCY, FRACTURES): Vit D, 25-Hydroxy: 50.1 ng/mL (ref 30.0–100.0)

## 2022-06-24 NOTE — Progress Notes (Signed)
Chief Complaint:   OBESITY Stephanie [name] is here to discuss her progress with her obesity treatment plan along with follow-up of her obesity related diagnoses. Stephanie [name] is on the Category 2 Plan and states she is following her eating plan approximately 0% of the time. Stephanie [name] states she is walking 15-30 minutes 4 times per week.  Today's visit was #: 23 Starting weight: 228 lbs Starting date: 02/12/2021 Today's weight: 169 lbs Today's date: 06/04/2022 Total lbs lost to date: 59 Total lbs lost since last in-office visit: 2  Interim History: Stephanie [name]'s last OV with Stephanie [name] was 03/24/2022. She has been doing well. Here for IC and fasting labs. Pt denies issues with plan.  Subjective:   1. SOB (shortness of breath) on exertion Kabao notes increasing shortness of breath with exercising and seems to be worsening over time with weight gain. She notes getting out of breath sooner with activity than she used to. This has gotten worse recently. Stephanie [name] denies shortness of breath at rest or orthopnea.  2. Diabetes mellitus type 2 in obese (HCC) Pt started Ozempic 0.25 mg weekly 2 weeks ago and is tolerating it well with no side effects. This week, she increased to 5 clicks beyond 0.25 mg dose and tolerated it well. Fasting blood sugar ranges 84-104.  3. Vitamin D deficiency She is currently taking prescription vitamin D 50,000 IU each week. She denies nausea, vomiting or muscle weakness.  4. Hyperlipidemia associated with type 2 diabetes mellitus (HCC) Pt had elevated triglycerides on last check.  Assessment/Plan:   Orders Placed This Encounter  Procedures   Comprehensive metabolic panel   Hemoglobin A1c   Insulin, random   Lipid Panel With LDL/HDL Ratio   VITAMIN D 25 Hydroxy (Vit-D Deficiency, Fractures)    Medications Discontinued During This Encounter  Medication Reason   Vitamin D, Ergocalciferol, (DRISDOL) 1.25 MG (50000 UNIT) CAPS capsule Reorder   metFORMIN (GLUCOPHAGE) 500 MG tablet Reorder      Meds ordered this encounter  Medications   Vitamin D, Ergocalciferol, (DRISDOL) 1.25 MG (50000 UNIT) CAPS capsule    Sig: 1 po q 14d    Dispense:  6 capsule    Refill:  0    Ov for RF; 90 d supply   metFORMIN (GLUCOPHAGE) 500 MG tablet    Sig: 1 po three times daily with meals    Dispense:  90 tablet    Refill:  0     1. SOB (shortness of breath) on exertion Repeat IC today was 1426 and on 01/2021 it was 1858. With pt's 60 lb weight loss, she needs less calories now and we have changed to category 1 with 6-8 oz protein at night.  2. Diabetes mellitus type 2 in obese (HCC) Good blood sugar control is important to decrease the likelihood of diabetic complications such as nephropathy, neuropathy, limb loss, blindness, coronary artery disease, and death. Intensive lifestyle modification including diet, exercise and weight loss are the first line of treatment for diabetes.   Refill- metFORMIN (GLUCOPHAGE) 500 MG tablet; 1 po three times daily with meals  Dispense: 90 tablet; Refill: 0  Lab/Orders today or future: - Hemoglobin A1c - Insulin, random  3. Vitamin D deficiency Low Vitamin D level contributes to fatigue and are associated with obesity, breast, and colon cancer. She agrees to continue to take prescription Vitamin D @50 ,000 IU every 2 weeks and will follow-up for routine testing of Vitamin D, at least 2-3 times per year to avoid over-replacement.  Refill- Vitamin D, Ergocalciferol, (DRISDOL) 1.25 MG (50000 UNIT) CAPS capsule; 1 po q 14d  Dispense: 6 capsule; Refill: 0  Lab/Orders today or future: - VITAMIN D 25 Hydroxy (Vit-D Deficiency, Fractures)  4. Hyperlipidemia associated with type 2 diabetes mellitus (HCC) Cardiovascular risk and specific lipid/LDL goals reviewed.  We discussed several lifestyle modifications today and Lorrain will continue to work on diet, exercise and weight loss efforts. Orders and follow up as documented in patient record.   Counseling Intensive  lifestyle modifications are the first line treatment for this issue. Dietary changes: Increase soluble fiber. Decrease simple carbohydrates. Exercise changes: Moderate to vigorous-intensity aerobic activity 150 minutes per week if tolerated. Lipid-lowering medications: see documented in medical record.  Lab/Orders today or future: - Comprehensive metabolic panel - Lipid Panel With LDL/HDL Ratio  5. Obesity, Current BMI 35.4 Stephanie [name] is currently in the action stage of change. As such, her goal is to continue with weight loss efforts. She has agreed to change to the Category 1 Plan with 6-8 oz protein at night..   Exercise goals: For substantial health benefits, adults should do at least 150 minutes (2 hours and 30 minutes) a week of moderate-intensity, or 75 minutes (1 hour and 15 minutes) a week of vigorous-intensity aerobic physical activity, or an equivalent combination of moderate- and vigorous-intensity aerobic activity. Aerobic activity should be performed in episodes of at least 10 minutes, and preferably, it should be spread throughout the week.  Behavioral modification strategies: planning for success.  Stephanie [name] has agreed to follow-up with our clinic in 3-4 weeks. She was informed of the importance of frequent follow-up visits to maximize her success with intensive lifestyle modifications for her multiple health conditions.   Stephanie [name] was informed we would discuss her lab results at her next visit unless there is a critical issue that needs to be addressed sooner. Stephanie [name] agreed to keep her next visit at the agreed upon time to discuss these results.  Objective:   Blood pressure 128/83, pulse 64, temperature (!) 97.5 F (36.4 C), height 4\' 10"  (1.473 m), weight 169 lb (76.7 kg), SpO2 98 %. Body mass index is 35.32 kg/m.  General: Cooperative, alert, well developed, in no acute distress. HEENT: Conjunctivae and lids unremarkable. Cardiovascular: Regular rhythm.  Lungs: Normal work of  breathing. Neurologic: No focal deficits.   Lab Results  Component Value Date   CREATININE 0.79 06/04/2022   BUN 20 06/04/2022   NA 140 06/04/2022   K 4.6 06/04/2022   CL 99 06/04/2022   CO2 27 06/04/2022   Lab Results  Component Value Date   ALT 20 06/04/2022   AST 23 06/04/2022   ALKPHOS 69 06/04/2022   BILITOT 0.3 06/04/2022   Lab Results  Component Value Date   HGBA1C 5.4 06/04/2022   HGBA1C 5.0 11/25/2021   HGBA1C 5.4 08/01/2021   HGBA1C 5.5 04/24/2021   HGBA1C 6.5 12/06/2020   Lab Results  Component Value Date   INSULIN 9.9 06/04/2022   INSULIN 13.2 11/25/2021   INSULIN 13.0 08/01/2021   INSULIN 15.6 02/12/2021   Lab Results  Component Value Date   TSH 1.740 09/06/2021   Lab Results  Component Value Date   CHOL 200 (H) 06/04/2022   HDL 84 06/04/2022   LDLCALC 87 06/04/2022   TRIG 177 (H) 06/04/2022   Lab Results  Component Value Date   VD25OH 50.1 06/04/2022   VD25OH 62.9 11/25/2021   VD25OH 72.0 08/01/2021   Lab Results  Component Value Date  WBC 5.3 02/12/2021   HGB 12.4 02/12/2021   HCT 37.3 02/12/2021   MCV 92 02/12/2021   PLT 256 02/12/2021   No results found for: "IRON", "TIBC", "FERRITIN"  Obesity Behavioral Intervention:   Approximately 15 minutes were spent on the discussion below.  ASK: We discussed the diagnosis of obesity with [name] today and Ronae agreed to give Stephanie [name] permission to discuss obesity behavioral modification therapy today.  ASSESS: Maleeah has the diagnosis of obesity and her BMI today is 35.4. Jasmeen is in the action stage of change.   ADVISE: [place] was educated on the multiple health risks of obesity as well as the benefit of weight loss to improve her health. She was advised of the need for long term treatment and the importance of lifestyle modifications to improve her current health and to decrease her risk of future health problems.  AGREE: Multiple dietary modification options and treatment options were  discussed and Herta agreed to follow the recommendations documented in the above note.  ARRANGE: Coriann was educated on the importance of frequent visits to treat obesity as outlined per CMS and USPSTF guidelines and agreed to schedule her next follow up appointment today.  Attestation Statements:   Reviewed by clinician on day of visit: allergies, medications, problem list, medical history, surgical history, family history, social history, and previous encounter notes.  I, Kathlene November, BS, CMA, am acting as transcriptionist for [employer], DO.  I have reviewed the above documentation for accuracy and completeness, and I agree with the above. Marjory Sneddon, D.O.  The [place] was signed into law in 2016 which includes the topic of electronic health records.  This provides immediate access to information in MyChart.  This includes consultation notes, operative notes, office notes, lab results and pathology reports.  If you have any questions about what you read please let Stephanie [name] know at your next visit so we can discuss your concerns and take corrective action if need be.  We are right here with you.

## 2022-07-03 ENCOUNTER — Encounter (INDEPENDENT_AMBULATORY_CARE_PROVIDER_SITE_OTHER): Payer: Self-pay | Admitting: Family Medicine

## 2022-07-03 ENCOUNTER — Ambulatory Visit (INDEPENDENT_AMBULATORY_CARE_PROVIDER_SITE_OTHER): Payer: Medicare Other | Admitting: Family Medicine

## 2022-07-03 VITALS — BP 135/76 | HR 69 | Temp 98.3°F | Ht <= 58 in | Wt 170.6 lb

## 2022-07-03 DIAGNOSIS — E785 Hyperlipidemia, unspecified: Secondary | ICD-10-CM

## 2022-07-03 DIAGNOSIS — E1169 Type 2 diabetes mellitus with other specified complication: Secondary | ICD-10-CM | POA: Diagnosis not present

## 2022-07-03 DIAGNOSIS — Z7985 Long-term (current) use of injectable non-insulin antidiabetic drugs: Secondary | ICD-10-CM | POA: Diagnosis not present

## 2022-07-03 DIAGNOSIS — Z6841 Body Mass Index (BMI) 40.0 and over, adult: Secondary | ICD-10-CM | POA: Insufficient documentation

## 2022-07-03 DIAGNOSIS — E66813 Obesity, class 3: Secondary | ICD-10-CM

## 2022-07-03 DIAGNOSIS — E669 Obesity, unspecified: Secondary | ICD-10-CM

## 2022-07-03 DIAGNOSIS — Z6835 Body mass index (BMI) 35.0-35.9, adult: Secondary | ICD-10-CM

## 2022-07-03 DIAGNOSIS — J3089 Other allergic rhinitis: Secondary | ICD-10-CM | POA: Diagnosis not present

## 2022-07-03 DIAGNOSIS — E559 Vitamin D deficiency, unspecified: Secondary | ICD-10-CM

## 2022-07-03 MED ORDER — LEVOCETIRIZINE DIHYDROCHLORIDE 5 MG PO TABS: 5.0000 mg | ORAL_TABLET | Freq: Every evening | ORAL | 0 refills | Status: AC

## 2022-07-16 NOTE — Progress Notes (Signed)
Chief Complaint:   OBESITY Stephanie [name] is here to discuss her progress with her obesity treatment plan along with follow-up of her obesity related diagnoses. Stephanie Stephanie [name] is on the Category 1 Plan+ 6-8 oz protein and states she is following her eating plan approximately 75% of the time. Stephanie [name] states she is walking 20 minutes 2-3 times per week.  Today's visit was #: 24 Starting weight: 228 lbs Starting date: 02/12/2021 Today's weight: 170 lbs Today's date: 07/03/2022 Total lbs lost to date: 58 lbs Total lbs lost since last in-office visit: +1 lb  Interim History: Patient has not been as active lately as usual.  No issues with meal plan.  Takes metformin and Ozempic regularly.  She is tolerating well and really helps control hunger and carb cravings.   Subjective:   1. Type 2 diabetes mellitus with obesity (Stephanie [name]) Discussed labs with patient today. She is not checking her blood sugars.  Ozempic had been increased to 0.5 mg weekly. A1c is 5.4 which is at goal.  Fasting insulin has decreased to 9.9.  2. Hyperlipidemia associated with type 2 diabetes mellitus (Stephanie [name]) Discussed labs with patient today. She has improving Triglycerides down to 177 and HDL increased to 84.  LDL is 87.  CMP WNL's.  She is taking Crestor.    3. Vitamin D deficiency Discussed labs with patient today. Vitamin D level 50.1.    4. Environmental and seasonal allergies She was taking Xyzal regularly, but she is out of it and is requesting a refill.   Assessment/Plan:  No orders of the defined types were placed in this encounter.   Medications Discontinued During This Encounter  Medication Reason   fluticasone-salmeterol (ADVAIR) 100-50 MCG/ACT AEPB Completed Course   levocetirizine (XYZAL) 5 MG tablet Completed Course     Meds ordered this encounter  Medications   levocetirizine (XYZAL) 5 MG tablet    Sig: Take 1 tablet (5 mg total) by mouth every evening.    Dispense:  30 tablet    Refill:  0     1. Type 2  diabetes mellitus with obesity ([facility]) Continue metformin and Ozempic 0.5 mg weekly every Thursday.  Patient decline need for refill or increase in dosing.    2. Hyperlipidemia associated with type 2 diabetes mellitus (Stephanie Stephanie [name]) Continue prudent nutritional plan, exercise and weight loss.   3. Vitamin D deficiency Continue same dose as Vitamin D is at goal.   4. Environmental and seasonal allergies Follow up with Allergist re:  rash, hives.   Refill - levocetirizine (XYZAL) 5 MG tablet; Take 1 tablet (5 mg total) by mouth every evening.  Dispense: 30 tablet; Refill: 0  5. Obesity, Current BMI 35.7 Stephanie [name] is currently in the action stage of change. As such, her goal is to continue with weight loss efforts. She has agreed to the Category 1 Plan+6-8 oz protein at night.    Exercise goals:  As is.   Behavioral modification strategies: holiday eating strategies .  Stephanie [name] has agreed to follow-up with our clinic in 8 weeks. She was informed of the importance of frequent follow-up visits to maximize her success with intensive lifestyle modifications for her multiple health conditions.   Objective:   Blood pressure 135/76, pulse 69, temperature 98.3 F (36.8 C), height 4\' 10"  (1.473 m), weight 170 lb 9.6 oz (77.4 kg), SpO2 98 %. Body mass index is 35.66 kg/m.  General: Cooperative, alert, well developed, in no acute distress. HEENT: Conjunctivae and lids unremarkable. Cardiovascular: Regular rhythm.  Lungs: Normal work of breathing. Neurologic: No focal deficits.   Lab Results  Component Value Date   CREATININE 0.79 06/04/2022   BUN 20 06/04/2022   NA 140 06/04/2022   K 4.6 06/04/2022   CL 99 06/04/2022   CO2 27 06/04/2022   Lab Results  Component Value Date   ALT 20 06/04/2022   AST 23 06/04/2022   ALKPHOS 69 06/04/2022   BILITOT 0.3 06/04/2022   Lab Results  Component Value Date   HGBA1C 5.4 06/04/2022   HGBA1C 5.0 11/25/2021   HGBA1C 5.4 08/01/2021   HGBA1C 5.5 04/24/2021    HGBA1C 6.5 12/06/2020   Lab Results  Component Value Date   INSULIN 9.9 06/04/2022   INSULIN 13.2 11/25/2021   INSULIN 13.0 08/01/2021   INSULIN 15.6 02/12/2021   Lab Results  Component Value Date   TSH 1.740 09/06/2021   Lab Results  Component Value Date   CHOL 200 (H) 06/04/2022   HDL 84 06/04/2022   LDLCALC 87 06/04/2022   TRIG 177 (H) 06/04/2022   Lab Results  Component Value Date   VD25OH 50.1 06/04/2022   VD25OH 62.9 11/25/2021   VD25OH 72.0 08/01/2021   Lab Results  Component Value Date   WBC 5.3 02/12/2021   HGB 12.4 02/12/2021   HCT 37.3 02/12/2021   MCV 92 02/12/2021   PLT 256 02/12/2021   No results found for: "IRON", "TIBC", "FERRITIN"  Attestation Statements:   Reviewed by clinician on day of visit: allergies, medications, problem list, medical history, surgical history, family history, social history, and previous encounter notes.  I, Malcolm Metro, RMA, am acting as Energy manager for [employer], DO.   I have reviewed the above documentation for accuracy and completeness, and I agree with the above. Carlye Grippe, D.O.  The 21st Century Cures Act was signed into law in 2016 which includes the topic of electronic health records.  This provides immediate access to information in MyChart.  This includes consultation notes, operative notes, office notes, lab results and pathology reports.  If you have any questions about what you read please let us know at your next visit so we can discuss your concerns and take corrective action if need be.  We are right here with you.

## 2022-08-01 ENCOUNTER — Other Ambulatory Visit (INDEPENDENT_AMBULATORY_CARE_PROVIDER_SITE_OTHER): Payer: Self-pay | Admitting: Family Medicine

## 2022-08-01 DIAGNOSIS — E559 Vitamin D deficiency, unspecified: Secondary | ICD-10-CM

## 2022-08-01 DIAGNOSIS — J302 Other seasonal allergic rhinitis: Secondary | ICD-10-CM

## 2022-08-21 ENCOUNTER — Ambulatory Visit (INDEPENDENT_AMBULATORY_CARE_PROVIDER_SITE_OTHER): Payer: Medicare Other | Admitting: Family Medicine

## 2022-09-03 ENCOUNTER — Ambulatory Visit (INDEPENDENT_AMBULATORY_CARE_PROVIDER_SITE_OTHER): Payer: Medicare Other | Admitting: Family Medicine

## 2022-09-11 ENCOUNTER — Ambulatory Visit (INDEPENDENT_AMBULATORY_CARE_PROVIDER_SITE_OTHER): Payer: Medicare Other | Admitting: Family Medicine

## 2022-09-17 DIAGNOSIS — J309 Allergic rhinitis, unspecified: Secondary | ICD-10-CM | POA: Diagnosis not present

## 2022-09-17 DIAGNOSIS — I10 Essential (primary) hypertension: Secondary | ICD-10-CM | POA: Diagnosis not present

## 2022-09-17 DIAGNOSIS — K219 Gastro-esophageal reflux disease without esophagitis: Secondary | ICD-10-CM | POA: Diagnosis not present

## 2022-09-17 DIAGNOSIS — G4700 Insomnia, unspecified: Secondary | ICD-10-CM | POA: Diagnosis not present

## 2022-09-17 DIAGNOSIS — E119 Type 2 diabetes mellitus without complications: Secondary | ICD-10-CM | POA: Diagnosis not present

## 2023-01-22 DIAGNOSIS — M7062 Trochanteric bursitis, left hip: Secondary | ICD-10-CM | POA: Diagnosis not present

## 2023-01-22 DIAGNOSIS — M1991 Primary osteoarthritis, unspecified site: Secondary | ICD-10-CM | POA: Diagnosis not present

## 2023-01-22 DIAGNOSIS — L508 Other urticaria: Secondary | ICD-10-CM | POA: Diagnosis not present

## 2023-01-22 DIAGNOSIS — R5383 Other fatigue: Secondary | ICD-10-CM | POA: Diagnosis not present

## 2023-01-22 DIAGNOSIS — M545 Low back pain, unspecified: Secondary | ICD-10-CM | POA: Diagnosis not present

## 2023-02-25 DIAGNOSIS — L501 Idiopathic urticaria: Secondary | ICD-10-CM | POA: Diagnosis not present

## 2023-02-25 DIAGNOSIS — J3089 Other allergic rhinitis: Secondary | ICD-10-CM | POA: Diagnosis not present

## 2023-02-25 DIAGNOSIS — J4520 Mild intermittent asthma, uncomplicated: Secondary | ICD-10-CM | POA: Diagnosis not present

## 2023-02-25 DIAGNOSIS — T783XXA Angioneurotic edema, initial encounter: Secondary | ICD-10-CM | POA: Diagnosis not present

## 2023-03-18 DIAGNOSIS — E7800 Pure hypercholesterolemia, unspecified: Secondary | ICD-10-CM | POA: Diagnosis not present

## 2023-03-18 DIAGNOSIS — K219 Gastro-esophageal reflux disease without esophagitis: Secondary | ICD-10-CM | POA: Diagnosis not present

## 2023-03-18 DIAGNOSIS — E1169 Type 2 diabetes mellitus with other specified complication: Secondary | ICD-10-CM | POA: Diagnosis not present

## 2023-03-18 DIAGNOSIS — G4700 Insomnia, unspecified: Secondary | ICD-10-CM | POA: Diagnosis not present

## 2023-03-18 DIAGNOSIS — J45909 Unspecified asthma, uncomplicated: Secondary | ICD-10-CM | POA: Diagnosis not present

## 2023-03-18 DIAGNOSIS — Z0000 Encounter for general adult medical examination without abnormal findings: Secondary | ICD-10-CM | POA: Diagnosis not present

## 2023-03-18 DIAGNOSIS — I10 Essential (primary) hypertension: Secondary | ICD-10-CM | POA: Diagnosis not present

## 2023-03-18 DIAGNOSIS — E119 Type 2 diabetes mellitus without complications: Secondary | ICD-10-CM | POA: Diagnosis not present

## 2023-03-19 DIAGNOSIS — J3089 Other allergic rhinitis: Secondary | ICD-10-CM | POA: Diagnosis not present

## 2023-03-19 DIAGNOSIS — T783XXA Angioneurotic edema, initial encounter: Secondary | ICD-10-CM | POA: Diagnosis not present

## 2023-03-19 DIAGNOSIS — J4520 Mild intermittent asthma, uncomplicated: Secondary | ICD-10-CM | POA: Diagnosis not present

## 2023-03-19 DIAGNOSIS — L501 Idiopathic urticaria: Secondary | ICD-10-CM | POA: Diagnosis not present

## 2023-04-06 DIAGNOSIS — H5203 Hypermetropia, bilateral: Secondary | ICD-10-CM | POA: Diagnosis not present

## 2023-04-06 DIAGNOSIS — H52223 Regular astigmatism, bilateral: Secondary | ICD-10-CM | POA: Diagnosis not present

## 2023-04-06 DIAGNOSIS — E119 Type 2 diabetes mellitus without complications: Secondary | ICD-10-CM | POA: Diagnosis not present

## 2023-04-06 DIAGNOSIS — H35033 Hypertensive retinopathy, bilateral: Secondary | ICD-10-CM | POA: Diagnosis not present

## 2023-06-10 DIAGNOSIS — L501 Idiopathic urticaria: Secondary | ICD-10-CM | POA: Diagnosis not present

## 2023-06-10 DIAGNOSIS — T783XXA Angioneurotic edema, initial encounter: Secondary | ICD-10-CM | POA: Diagnosis not present

## 2023-06-10 DIAGNOSIS — J3089 Other allergic rhinitis: Secondary | ICD-10-CM | POA: Diagnosis not present

## 2023-06-10 DIAGNOSIS — J4520 Mild intermittent asthma, uncomplicated: Secondary | ICD-10-CM | POA: Diagnosis not present

## 2023-06-25 DIAGNOSIS — Z23 Encounter for immunization: Secondary | ICD-10-CM | POA: Diagnosis not present

## 2023-10-06 DIAGNOSIS — E7800 Pure hypercholesterolemia, unspecified: Secondary | ICD-10-CM | POA: Diagnosis not present

## 2023-10-06 DIAGNOSIS — J45909 Unspecified asthma, uncomplicated: Secondary | ICD-10-CM | POA: Diagnosis not present

## 2023-10-06 DIAGNOSIS — K219 Gastro-esophageal reflux disease without esophagitis: Secondary | ICD-10-CM | POA: Diagnosis not present

## 2023-10-06 DIAGNOSIS — J309 Allergic rhinitis, unspecified: Secondary | ICD-10-CM | POA: Diagnosis not present

## 2023-10-06 DIAGNOSIS — E1169 Type 2 diabetes mellitus with other specified complication: Secondary | ICD-10-CM | POA: Diagnosis not present

## 2023-10-06 DIAGNOSIS — I10 Essential (primary) hypertension: Secondary | ICD-10-CM | POA: Diagnosis not present

## 2024-03-31 DIAGNOSIS — E1169 Type 2 diabetes mellitus with other specified complication: Secondary | ICD-10-CM | POA: Diagnosis not present

## 2024-03-31 DIAGNOSIS — J309 Allergic rhinitis, unspecified: Secondary | ICD-10-CM | POA: Diagnosis not present

## 2024-03-31 DIAGNOSIS — Z0000 Encounter for general adult medical examination without abnormal findings: Secondary | ICD-10-CM | POA: Diagnosis not present

## 2024-03-31 DIAGNOSIS — G4700 Insomnia, unspecified: Secondary | ICD-10-CM | POA: Diagnosis not present

## 2024-03-31 DIAGNOSIS — K219 Gastro-esophageal reflux disease without esophagitis: Secondary | ICD-10-CM | POA: Diagnosis not present

## 2024-03-31 DIAGNOSIS — E7800 Pure hypercholesterolemia, unspecified: Secondary | ICD-10-CM | POA: Diagnosis not present

## 2024-03-31 DIAGNOSIS — I10 Essential (primary) hypertension: Secondary | ICD-10-CM | POA: Diagnosis not present

## 2024-03-31 DIAGNOSIS — J45909 Unspecified asthma, uncomplicated: Secondary | ICD-10-CM | POA: Diagnosis not present
